# Patient Record
Sex: Female | Born: 1980 | Race: White | Hispanic: No | Smoking: Current some day smoker
Health system: Southern US, Community
[De-identification: ages and names within clinical notes are randomized; demographics above are authoritative.]

## PROBLEM LIST (undated history)

## (undated) DIAGNOSIS — K7460 Unspecified cirrhosis of liver: Secondary | ICD-10-CM

## (undated) DIAGNOSIS — Q613 Polycystic kidney, unspecified: Secondary | ICD-10-CM

## (undated) DIAGNOSIS — C801 Malignant (primary) neoplasm, unspecified: Secondary | ICD-10-CM

## (undated) DIAGNOSIS — C189 Malignant neoplasm of colon, unspecified: Secondary | ICD-10-CM

## (undated) DIAGNOSIS — S069XAA Unspecified intracranial injury with loss of consciousness status unknown, initial encounter: Secondary | ICD-10-CM

## (undated) DIAGNOSIS — K7031 Alcoholic cirrhosis of liver with ascites: Secondary | ICD-10-CM

## (undated) DIAGNOSIS — S069X9A Unspecified intracranial injury with loss of consciousness of unspecified duration, initial encounter: Secondary | ICD-10-CM

## (undated) HISTORY — DX: Unspecified intracranial injury with loss of consciousness status unknown, initial encounter: S06.9XAA

## (undated) HISTORY — PX: ESOPHAGOGASTRODUODENOSCOPY W/ BANDING: SHX1530

## (undated) HISTORY — DX: Polycystic kidney, unspecified: Q61.3

## (undated) HISTORY — DX: Unspecified intracranial injury with loss of consciousness of unspecified duration, initial encounter: S06.9X9A

## (undated) NOTE — *Deleted (*Deleted)
Initial Nutrition Assessment  DOCUMENTATION CODES:      INTERVENTION:  ***   NUTRITION DIAGNOSIS:     related to   as evidenced by  .  ***  GOAL:      ***  MONITOR:      REASON FOR ASSESSMENT:   Malnutrition Screening Tool    ASSESSMENT:   Pt presented with hypovolemic shock 2/2 intractable N/V.  PMH includes alcoholic cardiomyopathy, systolic heart failure w/ ICD, alcoholic cirrhosis, esophageal varices, h/o EtOH abuse (pt reports 1 year of sobriety).  Pt reports being in her usual state of health until ~1 month ago when she began having acute on chronic abdominal pain with N/V. Per pt, she was only able to keep liquids down and only with small amounts.   No PO intake documented. Of note, pt currently with orders for Ensure Enlive BID despite being on a clear liquid diet.   Per Care Everywhere, pt weighed 52.6kg on 12/04/19. Pt now weighs 49.9 kg. This indicates a 4.9% wt loss x2 months, which is insignificant for time frame.   UOP: x24 hours I/O: +2888.80ml since admit  Labs and medications reviewed.  NUTRITION - FOCUSED PHYSICAL EXAM:  {RD Focused Exam List:21252}  Diet Order:   Diet Order            Diet clear liquid Room service appropriate? Yes; Fluid consistency: Thin  Diet effective now                 EDUCATION NEEDS:      Skin:  Skin Assessment: Reviewed RN Assessment  Last BM:  10/20 type 7  Height:   Ht Readings from Last 1 Encounters:  01/29/20 5\' 2"  (1.575 m)    Weight:   Wt Readings from Last 1 Encounters:  01/29/20 49.9 kg    Ideal Body Weight:     BMI:  Body mass index is 20.12 kg/m.  Estimated Nutritional Needs:   Kcal:  1550-1750  Protein:  75-90 grams  Fluid:  >/=1.55L/d    ***

---
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
## 2016-08-02 DIAGNOSIS — N939 Abnormal uterine and vaginal bleeding, unspecified: Secondary | ICD-10-CM

## 2016-08-02 NOTE — ED Triage Notes (Signed)
Called to pick up patient out of bathroom floor.   On entering bathroom,  Pt in locked stall.  Pt states she passed out.   Pt leaning against wall.  States she is unable to get up and  Unlock door.    On entering stall.  Pt states she cannot get.  Up.  Pt picked up and placed in wheelchair

## 2016-08-02 NOTE — ED Triage Notes (Signed)
Pt has AA sponsor at bedside.  PT was in 90 day detox program for alcoholism and was recently released.  Pt states "life problems happened" that she was drinking today.  Pt states she has had a distended abdomen for months (since before detox program) and has had vaginal bleeding.  Pt states she has a history of uterine cancer and has had her uterus removed.

## 2016-08-02 NOTE — ED Notes (Signed)
Pt on bed with sponsor at bedside.  Vital signs stable and IVF continued.  Will continue to monitor pt and carry out orders.

## 2016-08-03 ENCOUNTER — Emergency Department: Admit: 2016-08-03 | Payer: Self-pay | Primary: Family Medicine

## 2016-08-03 ENCOUNTER — Inpatient Hospital Stay
Admit: 2016-08-03 | Discharge: 2016-08-03 | Disposition: A | Discharge status: Home / Self Care | Payer: Self-pay | Attending: Emergency Medicine

## 2016-08-03 LAB — HEPATIC FUNCTION PANEL
A-G Ratio: 0.8 (ref 0.8–1.7)
ALT (SGPT): 43 U/L (ref 13–56)
AST (SGOT): 59 U/L — ABNORMAL HIGH (ref 15–37)
Albumin: 3.1 g/dL — ABNORMAL LOW (ref 3.4–5.0)
Alk. phosphatase: 127 U/L — ABNORMAL HIGH (ref 45–117)
Bilirubin, direct: 0.2 MG/DL (ref 0.0–0.2)
Bilirubin, total: 0.3 MG/DL (ref 0.2–1.0)
Globulin: 4.1 g/dL — ABNORMAL HIGH (ref 2.0–4.0)
Protein, total: 7.2 g/dL (ref 6.4–8.2)

## 2016-08-03 LAB — CBC WITH AUTOMATED DIFF
ABS. BASOPHILS: 0.1 10*3/uL (ref 0.0–0.1)
ABS. EOSINOPHILS: 0.5 10*3/uL — ABNORMAL HIGH (ref 0.0–0.4)
ABS. LYMPHOCYTES: 4.5 10*3/uL — ABNORMAL HIGH (ref 0.9–3.6)
ABS. MONOCYTES: 0.9 10*3/uL (ref 0.05–1.2)
ABS. NEUTROPHILS: 3.9 10*3/uL (ref 1.8–8.0)
BASOPHILS: 1 % (ref 0–2)
EOSINOPHILS: 5 % (ref 0–5)
HCT: 39.4 % (ref 35.0–45.0)
HGB: 13.3 g/dL (ref 12.0–16.0)
LYMPHOCYTES: 45 % (ref 21–52)
MCH: 32.1 PG (ref 24.0–34.0)
MCHC: 33.8 g/dL (ref 31.0–37.0)
MCV: 95.2 FL (ref 74.0–97.0)
MONOCYTES: 9 % (ref 3–10)
MPV: 10 FL (ref 9.2–11.8)
NEUTROPHILS: 40 % (ref 40–73)
PLATELET: 206 10*3/uL (ref 135–420)
RBC: 4.14 M/uL — ABNORMAL LOW (ref 4.20–5.30)
RDW: 13.9 % (ref 11.6–14.5)
WBC: 9.9 10*3/uL (ref 4.6–13.2)

## 2016-08-03 LAB — URINALYSIS W/ RFLX MICROSCOPIC
Bilirubin: NEGATIVE
Glucose: NEGATIVE mg/dL
Ketone: NEGATIVE mg/dL
Leukocyte Esterase: NEGATIVE
Nitrites: NEGATIVE
Protein: NEGATIVE mg/dL
Specific gravity: 1.005 (ref 1.005–1.030)
Urobilinogen: 0.2 EU/dL (ref 0.2–1.0)
pH (UA): 6.5 (ref 5.0–8.0)

## 2016-08-03 LAB — METABOLIC PANEL, BASIC
Anion gap: 9 mmol/L (ref 3.0–18)
BUN/Creatinine ratio: 7 — ABNORMAL LOW (ref 12–20)
BUN: 4 MG/DL — ABNORMAL LOW (ref 7.0–18)
CO2: 25 mmol/L (ref 21–32)
Calcium: 8.2 MG/DL — ABNORMAL LOW (ref 8.5–10.1)
Chloride: 111 mmol/L — ABNORMAL HIGH (ref 100–108)
Creatinine: 0.6 MG/DL (ref 0.6–1.3)
GFR est AA: >60 mL/min/{1.73_m2} (ref >60)
GFR est non-AA: >60 mL/min/{1.73_m2} (ref >60)
Glucose: 82 mg/dL (ref 74–99)
Potassium: 3 mmol/L — ABNORMAL LOW (ref 3.5–5.5)
Sodium: 145 mmol/L (ref 136–145)

## 2016-08-03 LAB — URINE MICROSCOPIC ONLY
RBC: 0 /hpf (ref 0–5)
WBC: 0 /hpf (ref 0–4)

## 2016-08-03 LAB — DRUG SCREEN, URINE
AMPHETAMINES: NEGATIVE
BARBITURATES: NEGATIVE
BENZODIAZEPINES: NEGATIVE
COCAINE: NEGATIVE
METHADONE: NEGATIVE
OPIATES: NEGATIVE
PCP(PHENCYCLIDINE): NEGATIVE
THC (TH-CANNABINOL): NEGATIVE

## 2016-08-03 LAB — MAGNESIUM: Magnesium: 2.1 mg/dL (ref 1.6–2.6)

## 2016-08-03 LAB — ETHYL ALCOHOL: ALCOHOL(ETHYL),SERUM: 423 MG/DL — CR (ref 0–3)

## 2016-08-03 LAB — HCG QL SERUM: HCG, Ql.: NEGATIVE

## 2016-08-03 MED ORDER — SODIUM CHLORIDE 0.9% BOLUS IV
0.9 % | Freq: Once | INTRAVENOUS | Status: AC
Start: 2016-08-03 — End: 2016-08-03
  Administered 2016-08-03: 03:00:00 via INTRAVENOUS

## 2016-08-03 MED ORDER — MVI,ADULT NO.4,VIT K 3300 UNIT-150 MCG/10 ML INTRAVENOUS SOLUTION
3300 unit- 150 mcg/10 mL | Freq: Once | INTRAVENOUS | Status: AC
Start: 2016-08-03 — End: 2016-08-03
  Administered 2016-08-03: 04:00:00 via INTRAVENOUS

## 2016-08-03 MED FILL — SODIUM CHLORIDE 0.9 % IV: INTRAVENOUS | Qty: 1000

## 2016-08-03 NOTE — ED Provider Notes (Signed)
EMERGENCY DEPARTMENT HISTORY AND PHYSICAL EXAM    Date: 08/02/2016  Patient Name: Vanessa Rosales    History of Presenting Illness     Chief Complaint   Patient presents with   ??? Alcohol intoxication   ??? Vaginal Bleeding         History Provided By: Patient and AA Sponsor    Chief Complaint: vaginal bleeding and ETOH intoxication  Duration: 5 Days  Timing:  Acute  Location: vagina  Quality: unknown  Severity: Moderate  Modifying Factors: unknown  Associated Symptoms: unknown      Additional History (Context): Vanessa Rosales is a 36 y.o. female with prior partial hysterectomy, ETOH abuse who presents with vaginal bleeding x 3+d and acute ETOH intoxication.  Pt is <5d post-discharge from 44 ETOH detox program in [place] where pt lives.  Came here to be with family; sponsor has only known pt 5d.  Is c/o vaginal bleeding to brother.  Pt acutely intoxicated.    PCP: None        Past History     Past Medical History:  No past medical history on file.    Past Surgical History:  No past surgical history on file.    Family History:  No family history on file.    Social History:  Social History   Substance Use Topics   ??? Smoking status: Not on file   ??? Smokeless tobacco: Not on file   ??? Alcohol use Yes       Allergies:  No Known Allergies      Review of Systems   Review of Systems   Unable to perform ROS: Other (severe ETOH intoxication)   All other systems reviewed and are negative.    All Other Systems Negative  Physical Exam     Vitals:    08/03/16 0000 08/03/16 0145 08/03/16 0200 08/03/16 0330   BP: 110/77 108/70 110/75 106/72   Pulse: 94 (!) 105 (!) 101 100   Resp: '19 16 18 12   ' Temp:       SpO2: 98% 98% 97% 95%     Physical Exam   Constitutional: She is oriented to person, place, and time. She appears well-developed.   snoring   HENT:   Head: Normocephalic and atraumatic.   Eyes: Pupils are equal, round, and reactive to light.   Neck: No JVD present. No tracheal deviation present. No thyromegaly present.    Cardiovascular: Normal rate, regular rhythm and normal heart sounds.  Exam reveals no gallop and no friction rub.    No murmur heard.  Pulmonary/Chest: Effort normal and breath sounds normal. No stridor. No respiratory distress. She has no wheezes. She has no rales. She exhibits no tenderness.   Abdominal: Soft. She exhibits no distension and no mass. There is no tenderness. There is no rebound and no guarding.   Musculoskeletal: She exhibits no edema or tenderness.   Lymphadenopathy:     She has no cervical adenopathy.   Neurological: She is alert and oriented to person, place, and time.   Skin: Skin is warm and dry. No rash noted. No erythema. No pallor.   Psychiatric: She has a normal mood and affect. Her behavior is normal. Thought content normal.   Strong ETOH odor of breath   Nursing note and vitals reviewed.             Diagnostic Study Results     Labs -     Recent Results (from the past 12  hour(s))   CBC WITH AUTOMATED DIFF    Collection Time: 08/02/16 10:25 PM   Result Value Ref Range    WBC 9.9 4.6 - 13.2 K/uL    RBC 4.14 (L) 4.20 - 5.30 M/uL    HGB 13.3 12.0 - 16.0 g/dL    HCT 39.4 35.0 - 45.0 %    MCV 95.2 74.0 - 97.0 FL    MCH 32.1 24.0 - 34.0 PG    MCHC 33.8 31.0 - 37.0 g/dL    RDW 13.9 11.6 - 14.5 %    PLATELET 206 135 - 420 K/uL    MPV 10.0 9.2 - 11.8 FL    NEUTROPHILS 40 40 - 73 %    LYMPHOCYTES 45 21 - 52 %    MONOCYTES 9 3 - 10 %    EOSINOPHILS 5 0 - 5 %    BASOPHILS 1 0 - 2 %    ABS. NEUTROPHILS 3.9 1.8 - 8.0 K/UL    ABS. LYMPHOCYTES 4.5 (H) 0.9 - 3.6 K/UL    ABS. MONOCYTES 0.9 0.05 - 1.2 K/UL    ABS. EOSINOPHILS 0.5 (H) 0.0 - 0.4 K/UL    ABS. BASOPHILS 0.1 0.0 - 0.1 K/UL    DF AUTOMATED     METABOLIC PANEL, BASIC    Collection Time: 08/02/16 10:25 PM   Result Value Ref Range    Sodium 145 136 - 145 mmol/L    Potassium 3.0 (L) 3.5 - 5.5 mmol/L    Chloride 111 (H) 100 - 108 mmol/L    CO2 25 21 - 32 mmol/L    Anion gap 9 3.0 - 18 mmol/L    Glucose 82 74 - 99 mg/dL    BUN 4 (L) 7.0 - 18 MG/DL     Creatinine 0.60 0.6 - 1.3 MG/DL    BUN/Creatinine ratio 7 (L) 12 - 20      GFR est AA >60 >60 ml/min/1.70m    GFR est non-AA >60 >60 ml/min/1.732m   Calcium 8.2 (L) 8.5 - 10.1 MG/DL   URINALYSIS W/ RFLX MICROSCOPIC    Collection Time: 08/02/16 10:25 PM   Result Value Ref Range    Color YELLOW      Appearance CLEAR      Specific gravity 1.005 1.005 - 1.030      pH (UA) 6.5 5.0 - 8.0      Protein NEGATIVE  NEG mg/dL    Glucose NEGATIVE  NEG mg/dL    Ketone NEGATIVE  NEG mg/dL    Bilirubin NEGATIVE  NEG      Blood LARGE (A) NEG      Urobilinogen 0.2 0.2 - 1.0 EU/dL    Nitrites NEGATIVE  NEG      Leukocyte Esterase NEGATIVE  NEG     DRUG SCREEN, URINE    Collection Time: 08/02/16 10:25 PM   Result Value Ref Range    BENZODIAZEPINES NEGATIVE  NEG      BARBITURATES NEGATIVE  NEG      THC (TH-CANNABINOL) NEGATIVE  NEG      OPIATES NEGATIVE  NEG      PCP(PHENCYCLIDINE) NEGATIVE  NEG      COCAINE NEGATIVE  NEG      AMPHETAMINES NEGATIVE  NEG      METHADONE NEGATIVE  NEG      HDSCOM (NOTE)    ETHYL ALCOHOL    Collection Time: 08/02/16 10:25 PM   Result Value Ref Range    ALCOHOL(ETHYL),SERUM 423 (HH)  0 - 3 MG/DL   URINE MICROSCOPIC ONLY    Collection Time: 08/02/16 10:25 PM   Result Value Ref Range    WBC 0 to 3 0 - 4 /hpf    RBC 0 to 3 0 - 5 /hpf    Epithelial cells 1+ 0 - 5 /lpf    Bacteria 1+ (A) NEG /hpf   MAGNESIUM    Collection Time: 08/02/16 10:25 PM   Result Value Ref Range    Magnesium 2.1 1.6 - 2.6 mg/dL   HEPATIC FUNCTION PANEL    Collection Time: 08/02/16 10:25 PM   Result Value Ref Range    Protein, total 7.2 6.4 - 8.2 g/dL    Albumin 3.1 (L) 3.4 - 5.0 g/dL    Globulin 4.1 (H) 2.0 - 4.0 g/dL    A-G Ratio 0.8 0.8 - 1.7      Bilirubin, total 0.3 0.2 - 1.0 MG/DL    Bilirubin, direct 0.2 0.0 - 0.2 MG/DL    Alk. phosphatase 127 (H) 45 - 117 U/L    AST (SGOT) 59 (H) 15 - 37 U/L    ALT (SGPT) 43 13 - 56 U/L   HCG QL SERUM    Collection Time: 08/02/16 10:25 PM   Result Value Ref Range    HCG, Ql. NEGATIVE  NEG          Radiologic Studies -   US TRANSVAGINAL   Final Result        CT Results  (Last 48 hours)    None        CXR Results  (Last 48 hours)    None            Medical Decision Making   I am the first provider for this patient.    I reviewed the vital signs, available nursing notes, past medical history, past surgical history, family history and social history.    Vital Signs-Reviewed the patient's vital signs.    Records Reviewed: Nursing Notes    Procedures:  Pelvic Exam  Date/Time: 08/03/2016 12:48 AM  Performed by: PA  Procedure duration:  3 minutes.  Type of exam performed: speculum.    External genitalia appearance: normal.    Vaginal exam:  bleeding.    Bleeding: mild  Specimen(s) collected:  none.  Patient tolerance: Patient tolerated the procedure well with no immediate complications            Provider Notes (Medical Decision Making): vaginal bleeding; malignancy; excessive anticoagulation; thrombocytopenia; hematuria    +vaginal bleeding on exam.  Obtain US.    Clear medically then have pt speak with crisis re: possible inpatient treatment again.    3:19 AM  US shows a uterus.      Pt told nurse apparently she didn't want to be seen by a PA or NP; will be turning care over to ED attending at end of shift.  Will need several hours of sobriety before speaking with crisis.  She remains voluntary; will let her continue to rest.  Her sponsor, Baldo Ash, has left and knows to return between 10a-12p today.      4:06 AM  4:06 AM : Pt care transferred to Dr. Heber Carolina ,ED provider. History of patient complaint(s), available diagnostic reports and current treatment plan has been discussed thoroughly.   Bedside rounding on patient occured : no .  Intended disposition of patient : TBD  Pending diagnostics reports and/or labs (please list): sobriety      MED RECONCILIATION:  No current facility-administered medications for this encounter.      No current outpatient prescriptions on file.       Disposition:  TBD         Follow-up Information     None          There are no discharge medications for this patient.      Diagnosis     Clinical Impression: No diagnosis found.

## 2016-08-03 NOTE — ED Notes (Signed)
Pt transported to [place] by transport on stretcher

## 2016-08-03 NOTE — ED Notes (Signed)
Pt resting on stretcher with sponsor at bedside.  No acute distress noted and stable vital signs.  Will continue to monitor pt and await further orders.

## 2016-08-03 NOTE — ED Notes (Signed)
I have reviewed discharge instructions with patient.  No acute distress noted at this time, pt alert and oriented.  Stable at time of discharge.  Vital signs stable.  Pt refused to sign discharge instructions.  MD Hoffman witnessed.  Pt given paper scrubs to go to lobby.  Pt given phone to call for ride.

## 2016-08-03 NOTE — ED Notes (Signed)
Pt ambulated to bedside commode with stand by assistance.

## 2016-08-03 NOTE — ED Notes (Signed)
Pt sleeping with sponsor at bedside.  Continued IV fluids.  No acute distress noted.  Vital signs stable.  Will continue to monitor pt and await further orders.

## 2016-08-03 NOTE — ED Notes (Shared)
4:12 AM: Patient care assumed from PA Waldo Laine, ED provider. Patient complaint(s), current treatment plan, progression, and available diagnostic results have been discussed thoroughly.  Rounding occurred: yes  Intended Disposition: Home   Pending diagnostic reports and/or labs (please list): None, Ambulation    Progress notes:  Patient was asleep upon examination, but roused easily to voice. She states that she has had vaginal bleeding recently. States "I haven't had a period in years." Discussed with her that her [place] was normal, showed good blood flow, no cysts. Encouraged her to follow up with OB/GYN to rule out cancer. She voiced understanding. Patient reports that she was released from rehab on Monday, April 16 and traveled here from [place] to stay with her brother. She states that she was able to avoid drinking alcohol for her 15-hour train ride here, but "my brother sent me over the edge." Offered patient resources for outpatient follow-up, resources, and AA meetings. She declined and stated that she already has this information. Patient states that she has a sponsor and plan for discharge that she plans on carrying out. 4:17 AM     Patient concerned about getting home. She states that her sponsor, Claris Gower, has all of her possessions that were with her upon arrival to the ED. Provided phone to patient and [place]'s phone number: [phone]. 4:32 AM     Disposition: Discharge     Follow-up Information     Follow up With Details Comments Contact Info    [facility] Call in 1 day  [address]  [place] [address]  [phone]    [facility] EMERGENCY DEPT  As needed, If symptoms worsen [address]  [place] [address]  [phone]           Patient's Medications    No medications on file         Scribe Attestation:     Curt Jews, acting as a scribe for and in the presence of Vaughan Browner, MD      August 03, 2016 at 4:12 AM       Provider Attestation:       I personally performed the services described in the documentation, reviewed the documentation, as recorded by the scribe in my presence, and it accurately and completely records my words and actions. August 03, 2016 at 4:12 AM - Vaughan Browner, MD

## 2016-08-03 NOTE — ED Triage Notes (Signed)
Pt sleeping on stretcher.

## 2016-10-19 ENCOUNTER — Emergency Department: Payer: Self-pay | Primary: Family Medicine

## 2016-10-19 ENCOUNTER — Emergency Department: Admit: 2016-10-20 | Payer: Self-pay | Primary: Family Medicine

## 2016-10-19 DIAGNOSIS — R079 Chest pain, unspecified: Secondary | ICD-10-CM

## 2016-10-19 NOTE — ED Triage Notes (Signed)
Chest pressure since 0900 this AM. Accompanied by SOB and fatigue

## 2016-10-19 NOTE — ED Provider Notes (Signed)
HPI Comments: Vanessa Rosales is a 36 y.o. Female with c/o severe left sided sharp cp with radiation down left arm since this morning about 9am. Sob worse with movement, deep breathing. No fcs. nvd today as well. Recently had abortion for 14 week pregnancy a week ago. No recent leg pain, swelling, h/o vte. Admits to some alcohol earlier today. Smokes cigarettes. Nothing taken for pain    The history is provided by the patient.        Past Medical History:   Diagnosis Date   ??? Hypertension    ??? Mitral regurgitation        History reviewed. No pertinent surgical history.      History reviewed. No pertinent family history.    Social History     Social History   ??? Marital status: SINGLE     Spouse name: N/A   ??? Number of children: N/A   ??? Years of education: N/A     Occupational History   ??? Not on file.     Social History Main Topics   ??? Smoking status: Current Every Day Smoker     Packs/day: 0.25   ??? Smokeless tobacco: Never Used   ??? Alcohol use Yes   ??? Drug use: No   ??? Sexual activity: Not on file     Other Topics Concern   ??? Not on file     Social History Narrative         ALLERGIES: Bee venom protein (honey bee) and Sulfa (sulfonamide antibiotics)    Review of Systems   Constitutional: Negative for fever.   HENT: Negative for sore throat and trouble swallowing.    Eyes: Negative for visual disturbance.   Respiratory: Positive for shortness of breath. Negative for cough.    Cardiovascular: Positive for chest pain. Negative for leg swelling.   Gastrointestinal: Positive for abdominal pain.   Endocrine: Negative for polyuria.   Genitourinary: Negative for difficulty urinating and vaginal bleeding.   Musculoskeletal: Negative for gait problem.   Allergic/Immunologic: Negative for immunocompromised state.   Neurological: Negative for syncope.   Psychiatric/Behavioral: Positive for sleep disturbance.       Vitals:    10/20/16 0024 10/20/16 0130 10/20/16 0145 10/20/16 0200   BP:  (!) 127/99 (!) 137/97 (!) 131/92    Pulse: 96  88 82   Resp: '21  16 18   ' Temp:       SpO2: 97%  96% 96%   Weight:       Height:                Physical Exam   Constitutional: She is oriented to person, place, and time. She appears well-developed and well-nourished. She appears distressed.   Appears older than stated age. Non toxic. Appears intoxicated     HENT:   Head: Normocephalic and atraumatic.   Right Ear: External ear normal.   Left Ear: External ear normal.   Nose: Nose normal.   Mouth/Throat: Uvula is midline, oropharynx is clear and moist and mucous membranes are normal.   Eyes: Conjunctivae are normal. No scleral icterus.   Neck: Neck supple.   Cardiovascular: Normal rate, regular rhythm, normal heart sounds and intact distal pulses.    Pulmonary/Chest: Effort normal and breath sounds normal. She exhibits tenderness.   Abdominal: Soft. Normal appearance. She exhibits no distension and no mass. There is tenderness in the epigastric area and left upper quadrant. There is no rebound and no guarding.  Musculoskeletal: She exhibits no edema.   Neurological: She is alert and oriented to person, place, and time. Gait normal.   Skin: Skin is warm and dry. She is not diaphoretic.   Psychiatric: Her behavior is normal.   Nursing note and vitals reviewed.       MDM      ED Course       Procedures      Vitals:  Patient Vitals for the past 12 hrs:   Temp Pulse Resp BP SpO2   10/20/16 0200 - 82 18 (!) 131/92 96 %   10/20/16 0145 - 88 16 (!) 137/97 96 %   10/20/16 0130 - - - (!) 127/99 -   10/20/16 0024 - 96 21 - 97 %   10/20/16 0021 - - - 134/89 -   10/20/16 0010 - 88 20 - 96 %   10/20/16 0000 - 85 20 124/89 96 %   10/19/16 2345 - 85 15 127/88 97 %   10/19/16 2330 - 87 15 (!) 129/92 98 %   10/19/16 2315 - 90 22 (!) 131/91 96 %   10/19/16 2300 - 85 9 130/88 98 %   10/19/16 2245 - 95 19 (!) 146/101 97 %   10/19/16 2230 - - - (!) 139/103 -   10/19/16 2215 - 91 25 (!) 129/101 95 %   10/19/16 2141 98.3 ??F (36.8 ??C) (!) 111 14 (!) 142/100 96 %          Medications ordered:   Medications   morphine injection 2 mg (2 mg IntraVENous Refused 10/19/16 2223)   ketorolac (TORADOL) injection 15 mg (15 mg IntraVENous Refused 10/19/16 2223)   ondansetron (ZOFRAN) injection 4 mg (4 mg IntraVENous Refused 10/19/16 2223)   sodium chloride 0.9 % bolus infusion 1,000 mL (0 mL IntraVENous IV Completed 10/19/16 2330)   magnesium sulfate 2 g/50 ml IVPB (premix or compounded) (0 g IntraVENous IV Completed 10/19/16 2334)   0.9% sodium chloride with KCl 20 mEq/L infusion ( IntraVENous IV Completed 10/20/16 0026)   cefTRIAXone (ROCEPHIN) 1 g in 0.9% sodium chloride (MBP/ADV) 50 mL MBP (0 g IntraVENous IV Completed 10/20/16 0003)   technetium pentetate (Tc-DTPA) injection 1 millicurie (1 millicurie Inhalation Given 10/20/16 0055)   technetium albumin aggregated (MAA) solution 6.3 millicurie (6.3 millicuries IntraVENous Given 10/20/16 0055)         Lab findings:  Recent Results (from the past 12 hour(s))   EKG, 12 LEAD, INITIAL    Collection Time: 10/19/16  9:57 PM   Result Value Ref Range    Ventricular Rate 93 BPM    Atrial Rate 93 BPM    P-R Interval 156 ms    QRS Duration 84 ms    Q-T Interval 396 ms    QTC Calculation (Bezet) 492 ms    Calculated P Axis 69 degrees    Calculated R Axis 35 degrees    Calculated T Axis 61 degrees    Diagnosis       Normal sinus rhythm  Possible Left atrial enlargement  Anteroseptal infarct , age undetermined  Abnormal ECG  No previous ECGs available     CBC WITH AUTOMATED DIFF    Collection Time: 10/19/16 10:06 PM   Result Value Ref Range    WBC 7.2 4.6 - 13.2 K/uL    RBC 5.20 4.20 - 5.30 M/uL    HGB 16.5 (H) 12.0 - 16.0 g/dL    HCT 47.5 (H) 35.0 - 45.0 %  MCV 91.3 74.0 - 97.0 FL    MCH 31.7 24.0 - 34.0 PG    MCHC 34.7 31.0 - 37.0 g/dL    RDW 16.1 (H) 11.6 - 14.5 %    PLATELET 159 135 - 420 K/uL    MPV 9.8 9.2 - 11.8 FL    NEUTROPHILS 52 40 - 73 %    LYMPHOCYTES 30 21 - 52 %    MONOCYTES 13 (H) 3 - 10 %    EOSINOPHILS 4 0 - 5 %    BASOPHILS 1 0 - 2 %     ABS. NEUTROPHILS 3.8 1.8 - 8.0 K/UL    ABS. LYMPHOCYTES 2.2 0.9 - 3.6 K/UL    ABS. MONOCYTES 0.9 0.05 - 1.2 K/UL    ABS. EOSINOPHILS 0.3 0.0 - 0.4 K/UL    ABS. BASOPHILS 0.1 0.0 - 0.1 K/UL    DF AUTOMATED     D DIMER    Collection Time: 10/19/16 10:06 PM   Result Value Ref Range    D DIMER 0.64 (H) <0.46 ug/ml(FEU)   METABOLIC PANEL, COMPREHENSIVE    Collection Time: 10/19/16 10:06 PM   Result Value Ref Range    Sodium 145 136 - 145 mmol/L    Potassium 2.9 (LL) 3.5 - 5.5 mmol/L    Chloride 105 100 - 108 mmol/L    CO2 30 21 - 32 mmol/L    Anion gap 10 3.0 - 18 mmol/L    Glucose 102 (H) 74 - 99 mg/dL    BUN 5 (L) 7.0 - 18 MG/DL    Creatinine 0.57 (L) 0.6 - 1.3 MG/DL    BUN/Creatinine ratio 9 (L) 12 - 20      GFR est AA >60 >60 ml/min/1.63m    GFR est non-AA >60 >60 ml/min/1.756m   Calcium 7.9 (L) 8.5 - 10.1 MG/DL    Bilirubin, total 0.5 0.2 - 1.0 MG/DL    ALT (SGPT) 62 (H) 13 - 56 U/L    AST (SGOT) 124 (H) 15 - 37 U/L    Alk. phosphatase 177 (H) 45 - 117 U/L    Protein, total 7.2 6.4 - 8.2 g/dL    Albumin 3.4 3.4 - 5.0 g/dL    Globulin 3.8 2.0 - 4.0 g/dL    A-G Ratio 0.9 0.8 - 1.7     ETHYL ALCOHOL    Collection Time: 10/19/16 10:06 PM   Result Value Ref Range    ALCOHOL(ETHYL),SERUM 374 (HH) 0 - 3 MG/DL   LIPASE    Collection Time: 10/19/16 10:06 PM   Result Value Ref Range    Lipase 205 73 - 393 U/L   HCG QL SERUM    Collection Time: 10/19/16 10:06 PM   Result Value Ref Range    HCG, Ql. NEGATIVE  NEG     URINALYSIS W/ RFLX MICROSCOPIC    Collection Time: 10/19/16 10:30 PM   Result Value Ref Range    Color YELLOW      Appearance CLEAR      Specific gravity 1.009 1.005 - 1.030      pH (UA) 8.0 5.0 - 8.0      Protein NEGATIVE  NEG mg/dL    Glucose NEGATIVE  NEG mg/dL    Ketone NEGATIVE  NEG mg/dL    Bilirubin NEGATIVE  NEG      Blood NEGATIVE  NEG      Urobilinogen 0.2 0.2 - 1.0 EU/dL    Nitrites NEGATIVE  NEG  Leukocyte Esterase MODERATE (A) NEG     DRUG SCREEN, URINE    Collection Time: 10/19/16 10:30 PM    Result Value Ref Range    BENZODIAZEPINES NEGATIVE  NEG      BARBITURATES NEGATIVE  NEG      THC (TH-CANNABINOL) NEGATIVE  NEG      OPIATES NEGATIVE  NEG      PCP(PHENCYCLIDINE) NEGATIVE  NEG      COCAINE NEGATIVE  NEG      AMPHETAMINES NEGATIVE  NEG      METHADONE NEGATIVE  NEG      HDSCOM (NOTE)    URINE MICROSCOPIC ONLY    Collection Time: 10/19/16 10:30 PM   Result Value Ref Range    WBC 10 to 15 0 - 4 /hpf    RBC 0 0 - 5 /hpf    Epithelial cells FEW 0 - 5 /lpf    Bacteria 1+ (A) NEG /hpf       EKG interpretation by ED Physician:  nsr with no acute st tw changes  Rate 93, pr 156, qtc 492  No previous    Cardiac monitor: nl rate reg rhythm. No ectopy  Pulse ox: 96% ra        X-Ray, CT or other radiology findings or impressions:  XR CHEST PORT    (Results Pending)   NM LUNG PERFUSION W VENT    (Results Pending)   cxr with nap  vq with low prob  Progress notes, Consult notes or additional Procedure notes:   Was going to get cta given her sx and slightly elevated dimer but pt informed tech she had iodine allergy so wil get vq  Doubt need for serial testing. Pt with no nv here.   Will treat for uti, rec /fu as listed  I have discussed with patient and/or family/sig other the results, interpretation of any imaging if performed, suspected diagnosis and treatment plan to include instructions regarding the diagnoses listed to which understanding was expressed with all questions answered      Reevaluation of patient:   stable    Disposition:  Diagnosis:   1. Left sided chest pain    2. Acute UTI    3. Alcoholic intoxication with complication ([place])    4. Alcohol abuse    5. Hypokalemia    6. Non-intractable vomiting with nausea, unspecified vomiting type        Disposition: home    Follow-up Information     Follow up With Details Comments [facility] Schedule an appointment as soon as possible for a visit for follow up from your ER visit and recheck of your blood  pressure or with your regular doctor if you already have one [address] [place] Schedule an appointment as soon as possible for a visit for follow up on your alcohol use disorder [place]    [facility] EMERGENCY DEPT  If symptoms worsen [address]  [phone]            Patient's Medications   Start Taking    ACETAMINOPHEN (TYLENOL EXTRA STRENGTH) 500 MG TABLET    Take 2 Tabs by mouth every six (6) hours as needed for Pain.    CEPHALEXIN (KEFLEX) 500 MG CAPSULE    Take 2 Caps by mouth two (2)  times a day for 7 days.    FAMOTIDINE (PEPCID) 20 MG TABLET    Take 1 Tab by mouth two (2) times a day for 10 days.    ONDANSETRON (ZOFRAN ODT) 4 MG DISINTEGRATING TABLET    Take 1 Tab by mouth every eight (8) hours as needed for Nausea.   Continue Taking    LISINOPRIL (PRINIVIL, ZESTRIL) 20 MG TABLET    Take 20 mg by mouth two (2) times a day.   These Medications have changed    No medications on file   Stop Taking    No medications on file

## 2016-10-20 ENCOUNTER — Inpatient Hospital Stay
Admit: 2016-10-20 | Discharge: 2016-10-20 | Disposition: A | Discharge status: Home / Self Care | Payer: Self-pay | Attending: Emergency Medicine

## 2016-10-20 ENCOUNTER — Emergency Department: Admit: 2016-10-20 | Payer: Self-pay | Primary: Family Medicine

## 2016-10-20 LAB — EKG 12-LEAD
Atrial Rate: 93 {beats}/min
Diagnosis: NORMAL
P Axis: 69 degrees
P-R Interval: 156 ms
Q-T Interval: 396 ms
QRS Duration: 84 ms
QTc Calculation (Bazett): 492 ms
R Axis: 35 degrees
T Axis: 61 degrees
Ventricular Rate: 93 {beats}/min

## 2016-10-20 LAB — D-DIMER, QUANTITATIVE: D-Dimer, Quant: 0.64 ug/ml(FEU) — ABNORMAL HIGH (ref <0.46)

## 2016-10-20 LAB — EKG, 12 LEAD, INITIAL
Atrial Rate: 93 {beats}/min
Calculated P Axis: 69 degrees
Calculated R Axis: 35 degrees
Calculated T Axis: 61 degrees
Diagnosis: NORMAL
P-R Interval: 156 ms
Q-T Interval: 396 ms
QRS Duration: 84 ms
QTC Calculation (Bezet): 492 ms
Ventricular Rate: 93 {beats}/min

## 2016-10-20 LAB — DRUG SCREEN, URINE
AMPHETAMINES: NEGATIVE
BARBITURATES: NEGATIVE
BENZODIAZEPINES: NEGATIVE
COCAINE: NEGATIVE
METHADONE: NEGATIVE
OPIATES: NEGATIVE
PCP(PHENCYCLIDINE): NEGATIVE
THC (TH-CANNABINOL): NEGATIVE

## 2016-10-20 LAB — CBC WITH AUTOMATED DIFF
ABS. BASOPHILS: 0.1 10*3/uL (ref 0.0–0.1)
ABS. EOSINOPHILS: 0.3 10*3/uL (ref 0.0–0.4)
ABS. LYMPHOCYTES: 2.2 10*3/uL (ref 0.9–3.6)
ABS. MONOCYTES: 0.9 10*3/uL (ref 0.05–1.2)
ABS. NEUTROPHILS: 3.8 10*3/uL (ref 1.8–8.0)
BASOPHILS: 1 % (ref 0–2)
EOSINOPHILS: 4 % (ref 0–5)
HCT: 47.5 % — ABNORMAL HIGH (ref 35.0–45.0)
HGB: 16.5 g/dL — ABNORMAL HIGH (ref 12.0–16.0)
LYMPHOCYTES: 30 % (ref 21–52)
MCH: 31.7 PG (ref 24.0–34.0)
MCHC: 34.7 g/dL (ref 31.0–37.0)
MCV: 91.3 FL (ref 74.0–97.0)
MONOCYTES: 13 % — ABNORMAL HIGH (ref 3–10)
MPV: 9.8 FL (ref 9.2–11.8)
NEUTROPHILS: 52 % (ref 40–73)
PLATELET: 159 10*3/uL (ref 135–420)
RBC: 5.2 M/uL (ref 4.20–5.30)
RDW: 16.1 % — ABNORMAL HIGH (ref 11.6–14.5)
WBC: 7.2 10*3/uL (ref 4.6–13.2)

## 2016-10-20 LAB — URINALYSIS W/ RFLX MICROSCOPIC
Bilirubin: NEGATIVE
Blood: NEGATIVE
Glucose: NEGATIVE mg/dL
Ketone: NEGATIVE mg/dL
Nitrites: NEGATIVE
Protein: NEGATIVE mg/dL
Specific gravity: 1.009 (ref 1.005–1.030)
Urobilinogen: 0.2 EU/dL (ref 0.2–1.0)
pH (UA): 8 (ref 5.0–8.0)

## 2016-10-20 LAB — METABOLIC PANEL, COMPREHENSIVE
A-G Ratio: 0.9 (ref 0.8–1.7)
ALT (SGPT): 62 U/L — ABNORMAL HIGH (ref 13–56)
AST (SGOT): 124 U/L — ABNORMAL HIGH (ref 15–37)
Albumin: 3.4 g/dL (ref 3.4–5.0)
Alk. phosphatase: 177 U/L — ABNORMAL HIGH (ref 45–117)
Anion gap: 10 mmol/L (ref 3.0–18)
BUN/Creatinine ratio: 9 — ABNORMAL LOW (ref 12–20)
BUN: 5 MG/DL — ABNORMAL LOW (ref 7.0–18)
Bilirubin, total: 0.5 MG/DL (ref 0.2–1.0)
CO2: 30 mmol/L (ref 21–32)
Calcium: 7.9 MG/DL — ABNORMAL LOW (ref 8.5–10.1)
Chloride: 105 mmol/L (ref 100–108)
Creatinine: 0.57 MG/DL — ABNORMAL LOW (ref 0.6–1.3)
GFR est AA: >60 mL/min/{1.73_m2} (ref >60)
GFR est non-AA: >60 mL/min/{1.73_m2} (ref >60)
Globulin: 3.8 g/dL (ref 2.0–4.0)
Glucose: 102 mg/dL — ABNORMAL HIGH (ref 74–99)
Potassium: 2.9 mmol/L — CL (ref 3.5–5.5)
Protein, total: 7.2 g/dL (ref 6.4–8.2)
Sodium: 145 mmol/L (ref 136–145)

## 2016-10-20 LAB — D DIMER: D DIMER: 0.64 ug/ml(FEU) — ABNORMAL HIGH (ref <0.46)

## 2016-10-20 LAB — URINE MICROSCOPIC ONLY
RBC: 0 /hpf (ref 0–5)
WBC: 10 /hpf (ref 0–4)

## 2016-10-20 LAB — HCG QL SERUM: HCG, Ql.: NEGATIVE

## 2016-10-20 LAB — ETHYL ALCOHOL: ALCOHOL(ETHYL),SERUM: 374 MG/DL — CR (ref 0–3)

## 2016-10-20 LAB — LIPASE: Lipase: 205 U/L (ref 73–393)

## 2016-10-20 MED ORDER — SODIUM CHLORIDE 0.9 % IV
Freq: Once | INTRAVENOUS | Status: DC
Start: 2016-10-20 — End: 2016-10-19

## 2016-10-20 MED ORDER — CEFTRIAXONE 1 GRAM SOLUTION FOR INJECTION
1 gram | INTRAMUSCULAR | Status: AC
Start: 2016-10-20 — End: 2016-10-20
  Administered 2016-10-20: 04:00:00 via INTRAVENOUS

## 2016-10-20 MED ORDER — SODIUM CHLORIDE 0.9% BOLUS IV
0.9 % | Freq: Once | INTRAVENOUS | Status: AC
Start: 2016-10-20 — End: 2016-10-19
  Administered 2016-10-20: 03:00:00 via INTRAVENOUS

## 2016-10-20 MED ORDER — ONDANSETRON 4 MG TAB, RAPID DISSOLVE
4 mg | ORAL_TABLET | Freq: Three times a day (TID) | ORAL | 0 refills | Status: DC | PRN
Start: 2016-10-20 — End: 2017-07-16

## 2016-10-20 MED ORDER — MORPHINE 4 MG/ML INTRAVENOUS SOLUTION
4 mg/mL | INTRAVENOUS | Status: DC
Start: 2016-10-20 — End: 2016-10-20

## 2016-10-20 MED ORDER — TECHNETIUM PENTETATE
Freq: Once | Status: AC
Start: 2016-10-20 — End: 2016-10-20
  Administered 2016-10-20: 05:00:00 via RESPIRATORY_TRACT

## 2016-10-20 MED ORDER — FAMOTIDINE 20 MG TAB
20 mg | ORAL_TABLET | Freq: Two times a day (BID) | ORAL | 0 refills | Status: AC
Start: 2016-10-20 — End: 2016-10-30

## 2016-10-20 MED ORDER — CEPHALEXIN 500 MG CAP
500 mg | ORAL_CAPSULE | Freq: Two times a day (BID) | ORAL | 0 refills | Status: AC
Start: 2016-10-20 — End: 2016-10-27

## 2016-10-20 MED ORDER — MAGNESIUM SULFATE 2 GRAM/50 ML IVPB
2 gram/50 mL (4 %) | Freq: Once | INTRAVENOUS | Status: AC
Start: 2016-10-20 — End: 2016-10-19
  Administered 2016-10-20: 03:00:00 via INTRAVENOUS

## 2016-10-20 MED ORDER — IOPAMIDOL 76 % IV SOLN
370 mg iodine /mL (76 %) | Freq: Once | INTRAVENOUS | Status: DC
Start: 2016-10-20 — End: 2016-10-19

## 2016-10-20 MED ORDER — NS WITH POTASSIUM CHLORIDE 20 MEQ/L IV
20 mEq/L | Freq: Once | INTRAVENOUS | Status: AC
Start: 2016-10-20 — End: 2016-10-20
  Administered 2016-10-20: 03:00:00 via INTRAVENOUS

## 2016-10-20 MED ORDER — TECHNETIUM TO 99M ALBUMIN AGGREGATED
Freq: Once | Status: AC
Start: 2016-10-20 — End: 2016-10-20
  Administered 2016-10-20: 05:00:00 via INTRAVENOUS

## 2016-10-20 MED ORDER — KETOROLAC TROMETHAMINE 15 MG/ML INJECTION
15 mg/mL | INTRAMUSCULAR | Status: DC
Start: 2016-10-20 — End: 2016-10-20

## 2016-10-20 MED ORDER — ACETAMINOPHEN 500 MG TAB
500 mg | ORAL_TABLET | Freq: Four times a day (QID) | ORAL | 0 refills | Status: AC | PRN
Start: 2016-10-20 — End: ?

## 2016-10-20 MED ORDER — ONDANSETRON (PF) 4 MG/2 ML INJECTION
4 mg/2 mL | INTRAMUSCULAR | Status: DC
Start: 2016-10-20 — End: 2016-10-20

## 2016-10-20 MED FILL — SODIUM CHLORIDE 0.9 % IV: INTRAVENOUS | Qty: 100

## 2016-10-20 MED FILL — CEFTRIAXONE 1 GRAM SOLUTION FOR INJECTION: 1 gram | INTRAMUSCULAR | Qty: 1

## 2016-10-20 MED FILL — SODIUM CHLORIDE 0.9 % IV: INTRAVENOUS | Qty: 1000

## 2016-10-20 MED FILL — MORPHINE 4 MG/ML INTRAVENOUS SOLUTION: 4 mg/mL | INTRAVENOUS | Qty: 1

## 2016-10-20 MED FILL — ONDANSETRON (PF) 4 MG/2 ML INJECTION: 4 mg/2 mL | INTRAMUSCULAR | Qty: 2

## 2016-10-20 MED FILL — MAGNESIUM SULFATE 2 GRAM/50 ML IVPB: 2 gram/50 mL (4 %) | INTRAVENOUS | Qty: 50

## 2016-10-20 MED FILL — KETOROLAC TROMETHAMINE 15 MG/ML INJECTION: 15 mg/mL | INTRAMUSCULAR | Qty: 1

## 2016-10-20 MED FILL — NS WITH POTASSIUM CHLORIDE 20 MEQ/L IV: 20 mEq/L | INTRAVENOUS | Qty: 1000

## 2016-10-20 MED FILL — ISOVUE-370  76 % INTRAVENOUS SOLUTION: 370 mg iodine /mL (76 %) | INTRAVENOUS | Qty: 75

## 2016-10-20 NOTE — Progress Notes (Signed)
On keflex, awaiting sensitivity report. Eleyna Brugh J Carrera Kiesel, PA-C 12:29 PM

## 2016-10-20 NOTE — ED Notes (Signed)
I have reviewed discharge instructions with the patient.  The patient verbalized understanding.Patient armband removed and given to patient to take home.  Patient was informed of the privacy risks if armband lost or stolen

## 2016-10-22 LAB — CULTURE, URINE: Culture result:: >100000 — AB

## 2017-03-15 ENCOUNTER — Emergency Department: Admit: 2017-03-16 | Payer: Self-pay | Primary: Family Medicine

## 2017-03-15 DIAGNOSIS — K920 Hematemesis: Secondary | ICD-10-CM

## 2017-03-15 NOTE — ED Notes (Signed)
Pt's friend came to nurses station and yelled at staff wanting friend seen immidately and stated that there was no way all providers were busy and that they should be seen now.  RN explained that providers were in fact all busy and would see pt as soon as one is available.      2222:  Pt seen walking towards lobby with friend at side.  Charge RN to verify IV is removed.

## 2017-03-15 NOTE — ED Triage Notes (Signed)
Patient states she has been vomiting for several days.   Started vomiting blood yesterday.  Pt states she feels bad

## 2017-03-15 NOTE — ED Provider Notes (Signed)
EMERGENCY DEPARTMENT HISTORY AND PHYSICAL EXAM    8:29 PM      Date: 03/15/2017  Patient Name: Vanessa ElseJennifer Hollingshed    History of Presenting Illness     Chief Complaint   Patient presents with   ??? Blood in Vomit         History Provided By: Patient    Chief Complaint: Blood in vomit  Duration:  Days  Timing:  Constant  Location: Abd   Quality: Aching  Severity: Severe  Modifying Factors: none given  Associated Symptoms: nausea      Additional History (Context): Vanessa Rosales is a 36 y.o. female with mitral regurgitation and hypertension who presents with severe hematemesis that began 2 days ago. The pt also complains of nausea and is having trouble swallowing.  She denies melena, fever, chills and does not take blood thinners, but does have a family hx of polycystic kidney disease.  Pt does not drink alcohol frequently. She denies all other symptoms.      PCP: None    Current Outpatient Medications   Medication Sig Dispense Refill   ??? ondansetron (ZOFRAN ODT) 4 mg disintegrating tablet Take 1 Tab by mouth every eight (8) hours as needed for Nausea. 12 Tab 0   ??? acetaminophen (TYLENOL EXTRA STRENGTH) 500 mg tablet Take 2 Tabs by mouth every six (6) hours as needed for Pain. 40 Tab 0   ??? lisinopril (PRINIVIL, ZESTRIL) 20 mg tablet Take 20 mg by mouth two (2) times a day.         Past History     Past Medical History:  Past Medical History:   Diagnosis Date   ??? Hypertension    ??? Mitral regurgitation        Past Surgical History:  History reviewed. No pertinent surgical history.    Family History:  History reviewed. No pertinent family history.    Social History:  Social History     Tobacco Use   ??? Smoking status: Current Every Day Smoker     Packs/day: 0.25   ??? Smokeless tobacco: Never Used   Substance Use Topics   ??? Alcohol use: Yes   ??? Drug use: No       Allergies:  Allergies   Allergen Reactions   ??? Bee Venom Protein (Honey Bee) Anaphylaxis   ??? Ibuprofen Anaphylaxis   ??? Sulfa (Sulfonamide Antibiotics) Anaphylaxis    ??? Tylenol [Acetaminophen] Anaphylaxis   ??? Zofran [Ondansetron Hcl] Other (comments)     vomiting         Review of Systems     Review of Systems   Constitutional: Negative for chills and fever.   HENT: Positive for trouble swallowing.    Gastrointestinal: Positive for nausea and vomiting (blood present). Negative for blood in stool.   All other systems reviewed and are negative.        Physical Exam     Visit Vitals  BP (!) 168/96 (BP 1 Location: Left arm)   Pulse (!) 106   Temp 98.4 ??F (36.9 ??C)   Resp 16   Wt 56.6 kg (124 lb 11.2 oz)   SpO2 97%   BMI 22.81 kg/m??         Physical Exam:  .  Patient Vitals for the past 12 hrs:   Temp Pulse Resp BP SpO2   03/15/17 2002 98.4 ??F (36.9 ??C) (!) 106 16 (!) 168/96 97 %     Gen: Well developed, well nourished 36 y.o. female  HEENT: Normocephalic, atraumatic  Respiratory: No accessory muscle use No wheeze, No rales, No rhonchi. Normal chest wall excursion. No subcutaneous air, no rib crepitus  Cardiovascular: Regular rhythm and rate, Normal pulses, Normal perfusion. No edema  Gastrointestinal: Non distended, Non tender, No masses. No ascites. No organomegaly. No evidence of trauma  Musculoskeletal: Full range of motion at all other tested joints. No joint effusions.  Neuro: Normal strength, Normal sensation. Normal speech. No ataxia. Cranial Nerves II-XII normal as tested.  Skin: No rash, petechia or purpura. Warm and dry  Psyche: No suicidal ideation, No homicidal ideation. No hallucinations. Organized thoughts.  Heme: Normal  GU: Deferred      Diagnostic Study Results     Labs -  No results found for this or any previous visit (from the past 12 hour(s)).  Labs Reviewed   METABOLIC PANEL, COMPREHENSIVE - Abnormal; Notable for the following components:       Result Value    Potassium 3.0 (*)     Calcium 8.1 (*)     All other components within normal limits   ETHYL ALCOHOL - Abnormal; Notable for the following components:    ALCOHOL(ETHYL),SERUM 274 (*)      All other components within normal limits   CBC WITH AUTOMATED DIFF   PROTHROMBIN TIME + INR   PTT       Radiologic Studies -   No orders to display         Medical Decision Making   I am the first provider for this patient.    I reviewed the vital signs, available nursing notes, past medical history, past surgical history, family history and social history.    Vital Signs-Reviewed the patient's vital signs.        Records Reviewed: Nursing Notes and Old Medical Records (Time of Review: 8:29 PM)    ED Course: Progress Notes, Reevaluation, and Consults:  Pt had a NG lavage, discharge home for outpatient, follow up with PCP  Negative for blood. Refused rectal exam. Risks explained      Diagnosis     Diagnosis:   1. Hematemesis, presence of nausea not specified      There is no problem list on file for this patient.      Disposition:  Home or Self Care  03/16/2017  1:30 AM    Discharge Medications:   Discharge Medication List as of 03/16/2017  1:16 AM                  Medication List      ASK your doctor about these medications    acetaminophen 500 mg tablet  Commonly known as:  TYLENOL EXTRA STRENGTH  Take 2 Tabs by mouth every six (6) hours as needed for Pain.     lisinopril 20 mg tablet  Commonly known as:  PRINIVIL, ZESTRIL     ondansetron 4 mg disintegrating tablet  Commonly known as:  ZOFRAN ODT  Take 1 Tab by mouth every eight (8) hours as needed for Nausea.          _______________________________       Scribe Severiano Gilbert acting as a scribe for and in the presence of Lamon Rotundo, Philomena Course, MD      March 15, 2017 at 8:29 PM       Provider Attestation:      I personally performed the services described in the documentation, reviewed the documentation, as recorded by the scribe in  my presence, and it accurately and completely records my words and actions. March 15, 2017 at 8:29 PM - Anayia Eugene, Philomena Courseraig A, MD        _______________________________

## 2017-03-16 ENCOUNTER — Inpatient Hospital Stay
Admit: 2017-03-16 | Discharge: 2017-03-16 | Disposition: A | Discharge status: Home / Self Care | Payer: Self-pay | Attending: Emergency Medicine

## 2017-03-16 LAB — CBC WITH AUTOMATED DIFF
ABS. BASOPHILS: 0 10*3/uL (ref 0.0–0.1)
ABS. EOSINOPHILS: 0.1 10*3/uL (ref 0.0–0.4)
ABS. LYMPHOCYTES: 2.4 10*3/uL (ref 0.9–3.6)
ABS. MONOCYTES: 0.7 10*3/uL (ref 0.05–1.2)
ABS. NEUTROPHILS: 4 10*3/uL (ref 1.8–8.0)
BASOPHILS: 0 % (ref 0–2)
EOSINOPHILS: 2 % (ref 0–5)
HCT: 44.1 % (ref 35.0–45.0)
HGB: 15.6 g/dL (ref 12.0–16.0)
LYMPHOCYTES: 33 % (ref 21–52)
MCH: 31.8 PG (ref 24.0–34.0)
MCHC: 35.4 g/dL (ref 31.0–37.0)
MCV: 89.8 FL (ref 74.0–97.0)
MONOCYTES: 10 % (ref 3–10)
MPV: 9.6 FL (ref 9.2–11.8)
NEUTROPHILS: 55 % (ref 40–73)
PLATELET: 172 10*3/uL (ref 135–420)
RBC: 4.91 M/uL (ref 4.20–5.30)
RDW: 14 % (ref 11.6–14.5)
WBC: 7.3 10*3/uL (ref 4.6–13.2)

## 2017-03-16 LAB — METABOLIC PANEL, COMPREHENSIVE
A-G Ratio: 1 (ref 0.8–1.7)
ALT (SGPT): 26 U/L (ref 13–56)
AST (SGOT): 35 U/L (ref 15–37)
Albumin: 4 g/dL (ref 3.4–5.0)
Alk. phosphatase: 91 U/L (ref 45–117)
Anion gap: 8 mmol/L (ref 3.0–18)
BUN/Creatinine ratio: 13 (ref 12–20)
BUN: 8 MG/DL (ref 7.0–18)
Bilirubin, total: 0.5 MG/DL (ref 0.2–1.0)
CO2: 24 mmol/L (ref 21–32)
Calcium: 8.1 MG/DL — ABNORMAL LOW (ref 8.5–10.1)
Chloride: 106 mmol/L (ref 100–108)
Creatinine: 0.6 MG/DL (ref 0.6–1.3)
GFR est AA: >60 mL/min/{1.73_m2} (ref >60)
GFR est non-AA: >60 mL/min/{1.73_m2} (ref >60)
Globulin: 4 g/dL (ref 2.0–4.0)
Glucose: 74 mg/dL (ref 74–99)
Potassium: 3 mmol/L — ABNORMAL LOW (ref 3.5–5.5)
Protein, total: 8 g/dL (ref 6.4–8.2)
Sodium: 138 mmol/L (ref 136–145)

## 2017-03-16 LAB — PROTHROMBIN TIME + INR
INR: 1 (ref 0.8–1.2)
Prothrombin time: 12.7 s (ref 11.5–15.2)

## 2017-03-16 LAB — PTT: aPTT: 31.5 s (ref 23.0–36.4)

## 2017-03-16 LAB — ETHYL ALCOHOL: ALCOHOL(ETHYL),SERUM: 274 MG/DL — ABNORMAL HIGH (ref 0–3)

## 2017-03-16 MED ORDER — LIDOCAINE 2 % MUCOUS MEMBRANE JELLY IN APPLICATOR
2 % | Freq: Once | Status: DC
Start: 2017-03-16 — End: 2017-03-16

## 2017-03-16 MED ORDER — LIDOCAINE 2 % MUCOUS MEMBRANE JELLY IN APPLICATOR
2 % | Status: DC
Start: 2017-03-16 — End: 2017-03-16

## 2017-03-16 MED FILL — LIDOCAINE 2 % MUCOUS MEMBRANE JELLY IN APPLICATOR: 2 % | Qty: 10

## 2017-03-16 NOTE — ED Notes (Signed)
I have reviewed discharge instructions with the patient.  The patient verbalized understanding. Patient armband removed and given to patient to take home.  Patient was informed of the privacy risks if armband lost or stolen

## 2017-03-16 NOTE — ED Notes (Signed)
Pt given ice water per request.  MD Dates authorized.

## 2017-04-12 DIAGNOSIS — C189 Malignant neoplasm of colon, unspecified: Secondary | ICD-10-CM

## 2017-04-12 HISTORY — DX: Malignant neoplasm of colon, unspecified: C18.9

## 2017-07-16 ENCOUNTER — Emergency Department: Admit: 2017-07-16 | Primary: Family Medicine

## 2017-07-16 ENCOUNTER — Inpatient Hospital Stay
Admit: 2017-07-16 | Discharge: 2017-07-19 | Discharge status: Against Medical Advice | Attending: Family Medicine | Admitting: Family Medicine

## 2017-07-16 DIAGNOSIS — G43A0 Cyclical vomiting, not intractable: Principal | ICD-10-CM

## 2017-07-16 LAB — CBC WITH AUTOMATED DIFF
ABS. BASOPHILS: 0 10*3/uL (ref 0.0–0.1)
ABS. EOSINOPHILS: 0 10*3/uL (ref 0.0–0.5)
ABS. IMM. GRANS.: 0 10*3/uL (ref 0–0.03)
ABS. LYMPHOCYTES: 0.4 10*3/uL — ABNORMAL LOW (ref 1.2–3.7)
ABS. MONOCYTES: 0.4 10*3/uL (ref 0.2–0.8)
ABS. NEUTROPHILS: 2.8 10*3/uL (ref 1.6–6.1)
BASOPHILS: 1 % (ref 0–1.2)
EOSINOPHILS: 1 %
HCT: 41.2 % (ref 36.0–45.0)
HGB: 14.2 g/dL (ref 11.8–14.8)
IMMATURE GRANULOCYTES: 0 % (ref 0–0.4)
LYMPHOCYTES: 12 %
MCH: 33.4 PG — ABNORMAL HIGH (ref 25.6–32.2)
MCHC: 34.5 g/dL (ref 32.2–35.5)
MCV: 96.9 FL — ABNORMAL HIGH (ref 80.0–95.0)
MONOCYTES: 10 %
MPV: 11.5 FL (ref 9.4–12.4)
NEUTROPHILS: 76 %
PLATELET: 125 10*3/uL — ABNORMAL LOW (ref 150–400)
RBC: 4.25 M/uL (ref 3.93–5.22)
RDW: 14.4 % (ref 11.6–14.4)
WBC: 3.7 10*3/uL — ABNORMAL LOW (ref 4.0–10.0)

## 2017-07-16 LAB — URINE MICROSCOPIC WITH REFLEX CULTURE: Hyaline cast: NONE SEEN /lpf

## 2017-07-16 LAB — METABOLIC PANEL, BASIC
Anion gap: 10 mmol/L (ref 5–15)
BUN/Creatinine ratio: 17 (ref 12–20)
BUN: 10 MG/DL (ref 7–18)
CO2: 33 mmol/L — ABNORMAL HIGH (ref 21–32)
Calcium: 9.5 MG/DL (ref 8.5–10.1)
Chloride: 94 mmol/L — ABNORMAL LOW (ref 98–110)
Creatinine: 0.59 MG/DL (ref 0.55–1.02)
GFR est AA: >60 mL/min/{1.73_m2} (ref >60)
GFR est non-AA: >60 mL/min/{1.73_m2} (ref >60)
Glucose: 119 mg/dL — ABNORMAL HIGH (ref 70–100)
Potassium: 3.4 mmol/L — ABNORMAL LOW (ref 3.5–5.1)
Sodium: 137 mmol/L (ref 136–145)

## 2017-07-16 LAB — HEPATIC FUNCTION PANEL
A-G Ratio: 1 (ref 1.0–3.0)
ALT (SGPT): 247 U/L — ABNORMAL HIGH (ref 13–56)
AST (SGOT): 302 U/L — ABNORMAL HIGH (ref 15–37)
Albumin: 4.2 g/dL (ref 3.4–5.0)
Alk. phosphatase: 167 U/L — ABNORMAL HIGH (ref 45–117)
Bilirubin, direct: 0.51 mg/dL — ABNORMAL HIGH (ref 0.00–0.20)
Bilirubin, total: 1.01 mg/dL (ref 0.20–1.20)
Globulin: 4.2 g/dL — ABNORMAL HIGH (ref 2.5–3.5)
Protein, total: 8.4 g/dL — ABNORMAL HIGH (ref 6.4–8.2)

## 2017-07-16 LAB — UA WITH REFLEX MICRO AND CULTURE
Glucose: NEGATIVE mg/dL
Ketone: 40 mg/dL — AB
Nitrites: POSITIVE — AB
Protein: 100 mg/dL — AB
Specific gravity: 1.029 (ref 1.001–1.035)
Urobilinogen: 4 EU/dL — ABNORMAL HIGH (ref 0.2–1.0)
pH (UA): 7 (ref 5–8)

## 2017-07-16 LAB — HCG QL SERUM: HCG, Ql.: NEGATIVE

## 2017-07-16 LAB — ETHYL ALCOHOL: ALCOHOL(ETHYL),SERUM: 3.9 MG/DL — ABNORMAL HIGH (ref <3.0)

## 2017-07-16 LAB — LIPASE: Lipase: 120 U/L (ref 73–393)

## 2017-07-16 LAB — AMMONIA: Ammonia, plasma: 41 umol/L — ABNORMAL HIGH (ref 11–32)

## 2017-07-16 MED ORDER — SODIUM CHLORIDE 0.9 % IJ SYRG
Freq: Once | INTRAMUSCULAR | Status: DC
Start: 2017-07-16 — End: 2017-07-16

## 2017-07-16 MED ORDER — SODIUM CHLORIDE 0.9 % IV PIGGY BACK
1 gram | INTRAVENOUS | Status: AC
Start: 2017-07-16 — End: 2017-07-16
  Administered 2017-07-16: 22:00:00 via INTRAVENOUS

## 2017-07-16 MED ORDER — PROMETHAZINE 25 MG/ML INJECTION
25 mg/mL | Freq: Once | INTRAMUSCULAR | Status: AC
Start: 2017-07-16 — End: 2017-07-16
  Administered 2017-07-16: 20:00:00 via INTRAVENOUS

## 2017-07-16 MED ORDER — METOCLOPRAMIDE 5 MG/ML IJ SOLN
5 mg/mL | INTRAMUSCULAR | Status: AC
Start: 2017-07-16 — End: 2017-07-16
  Administered 2017-07-16: 23:00:00 via INTRAVENOUS

## 2017-07-16 MED ORDER — SODIUM CHLORIDE 0.9% BOLUS IV
0.9 % | Freq: Once | INTRAVENOUS | Status: AC
Start: 2017-07-16 — End: 2017-07-16
  Administered 2017-07-16: 20:00:00 via INTRAVENOUS

## 2017-07-16 MED ORDER — SODIUM CHLORIDE 0.9 % INJECTION
20 mg/2 mL | INTRAMUSCULAR | Status: AC
Start: 2017-07-16 — End: 2017-07-16
  Administered 2017-07-16: 22:00:00 via INTRAVENOUS

## 2017-07-16 MED ORDER — HYDROMORPHONE (PF) 1 MG/ML IJ SOLN
1 mg/mL | INTRAMUSCULAR | Status: DC
Start: 2017-07-16 — End: 2017-07-16
  Administered 2017-07-16: 23:00:00 via INTRAVENOUS

## 2017-07-16 MED ORDER — PROMETHAZINE 25 MG/ML INJECTION
25 mg/mL | Freq: Once | INTRAMUSCULAR | Status: AC
Start: 2017-07-16 — End: 2017-07-16
  Administered 2017-07-16: 22:00:00 via INTRAVENOUS

## 2017-07-16 MED ORDER — HYDROMORPHONE 0.5 MG/0.5 ML SYRINGE
0.5 mg/ mL | INTRAMUSCULAR | Status: AC
Start: 2017-07-16 — End: 2017-07-17

## 2017-07-16 MED FILL — HYDROMORPHONE 0.5 MG/0.5 ML SYRINGE: 0.5 mg/ mL | INTRAMUSCULAR | Qty: 0.5

## 2017-07-16 MED FILL — PROMETHAZINE 25 MG/ML INJECTION: 25 mg/mL | INTRAMUSCULAR | Qty: 1

## 2017-07-16 MED FILL — CEFTRIAXONE 1 GRAM SOLUTION FOR INJECTION: 1 gram | INTRAMUSCULAR | Qty: 1

## 2017-07-16 MED FILL — HYDROMORPHONE (PF) 1 MG/ML IJ SOLN: 1 mg/mL | INTRAMUSCULAR | Qty: 1

## 2017-07-16 MED FILL — SODIUM CHLORIDE 0.9 % IV: INTRAVENOUS | Qty: 1000

## 2017-07-16 MED FILL — NORMAL SALINE FLUSH 0.9 % INJECTION SYRINGE: INTRAMUSCULAR | Qty: 10

## 2017-07-16 MED FILL — METOCLOPRAMIDE 5 MG/ML IJ SOLN: 5 mg/mL | INTRAMUSCULAR | Qty: 2

## 2017-07-16 MED FILL — FAMOTIDINE (PF) 20 MG/2 ML IV: 20 mg/2 mL | INTRAVENOUS | Qty: 2

## 2017-07-16 NOTE — ED Notes (Signed)
'  cold', given additional warm blankets. Fiance in room

## 2017-07-16 NOTE — ED Notes (Signed)
Verbal shift change report given to jeff (Cabin crewoncoming nurse) by Misty Stanleylisa (offgoing nurse). Report included the following information SBAR, Kardex, ED Summary, Intake/Output, MAR and Recent Results.

## 2017-07-16 NOTE — Other (Signed)
TRANSFER - IN REPORT:    Verbal report received from Debbie, RN(name) on Coral ElseJennifer Madaris  being received from 5S(unit) for routine progression of care      Report consisted of patient???s Situation, Background, Assessment and   Recommendations(SBAR).     Information from the following report(s) SBAR was reviewed with the receiving nurse.    Opportunity for questions and clarification was provided.      Assessment completed upon patient???s arrival to unit and care assumed.

## 2017-07-16 NOTE — ED Notes (Addendum)
Did vomit ~ 75 cc clear vomit. [facility]

## 2017-07-16 NOTE — ED Notes (Signed)
Vomited small amt of bile.

## 2017-07-16 NOTE — ED Notes (Signed)
Pt states she has been vomiting daily since last sept. Has not seen a GI MD

## 2017-07-16 NOTE — ED Notes (Signed)
Lab made aware of add-on

## 2017-07-16 NOTE — H&P (Signed)
History & Physical     Primary Care Provider: None  Date of service: 07/16/17    History of Presenting Illness:     Vanessa Rosales is a 37 y.o. 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 Patient has had epigastric pain on and off since last December.  She was seen in December and told she had a gastric bleed and was treated with Protonix.  They are never seemed to be a gastric roll follow-up.  She then had nausea and vomiting multiple times this past winter and was told she had flu and than a viral illness.  For the past 1 week patient has had abdominal pain in epigastric area that is sharp and mild to severe.  It comes and goes.  She has been vomiting largely clear yellow fluid and is having trouble keeping liquids down.  Patient felt her abdomen was distended.  She has had some chills to go along with this but is worse when she is vomiting.  No fever, sweats, dysuria, hematuria, chest pain, shortness of breath, cough, sputum production, diarrhea, syncope, presyncope.  Over the last 3 months patient has had some bowel movement with stringy yellow weight material in it however this seems to coincide with when she had flu and viral illness.    ED course:  In the emergency room the patient received ceftriaxone, Dilaudid, Reglan, Phenergan, Pepcid, 1 L normal saline    Past Medical History:   Diagnosis Date   ??? Hypertension    ??? Mitral regurgitation       Past Surgical History:   Procedure Laterality Date   ??? HX HYSTEROSCOPY     ??? HX OTHER SURGICAL      foot surgery     Prior to Admission medications    Medication Sig Start Date End Date Taking? Authorizing Provider   lisinopril (PRINIVIL, ZESTRIL) 20 mg tablet Take 20 mg by mouth two (2) times a day.   Yes Other, Phys, MD   acetaminophen (TYLENOL EXTRA STRENGTH) 500 mg tablet Take 2 Tabs by mouth  every six (6) hours as needed for Pain. 10/20/16   Argie Ramming, MD     Allergies   Allergen Reactions   ??? Bee Venom Protein (Honey Bee) Anaphylaxis   ??? Ibuprofen Anaphylaxis   ??? Iodinated Contrast- Oral And Iv Dye Anaphylaxis   ??? Shellfish Derived Anaphylaxis   ??? Sulfa (Sulfonamide Antibiotics) Anaphylaxis   ??? Tylenol [Acetaminophen] Anaphylaxis   ??? Doxycycline Rash   ??? Zofran [Ondansetron Hcl] Other (comments)     vomiting      Family History   Problem Relation Age of Onset   ??? Hypertension Mother    ??? Arthritis Mother    ??? Alcohol abuse Father    ??? Kidney Disease Father         ESRD      Social History     Socioeconomic History   ??? Marital status: SINGLE     Spouse name: Not on file   ??? Number of children: Not on file   ??? Years of education: Not on file   ??? Highest education level: Not on file   Occupational History   ??? Not on file   Social Needs   ??? Financial resource strain: Not on file   ??? Food insecurity:     Worry: Not on file  Inability: Not on file   ??? Transportation needs:     Medical: Not on file     Non-medical: Not on file   Tobacco Use   ??? Smoking status: Current Every Day Smoker     Packs/day: 0.50     Years: 4.00     Pack years: 2.00   ??? Smokeless tobacco: Never Used   Substance and Sexual Activity   ??? Alcohol use: Yes     Frequency: Monthly or less     Comment: last drink supposedly 1 mo ago    ??? Drug use: No   ??? Sexual activity: Yes     Birth control/protection: None   Lifestyle   ??? Physical activity:     Days per week: Not on file     Minutes per session: Not on file   ??? Stress: Not on file   Relationships   ??? Social connections:     Talks on phone: Not on file     Gets together: Not on file     Attends religious service: Not on file     Active member of club or organization: Not on file     Attends meetings of clubs or organizations: Not on file     Relationship status: Not on file   ??? Intimate partner violence:     Fear of current or ex partner: Not on file      Emotionally abused: Not on file     Physically abused: Not on file     Forced sexual activity: Not on file   Other Topics Concern   ??? Not on file   Social History Narrative   ??? Not on file         REVIEW OF SYSTEMS:       _0  All systems reviewed and negative except as above in HPI      _1  Unable to obtain  ROS due to  _2 mental status change  _3 sedated   _4 intubated      Objective:     Physical Exam:     Visit Vitals  BP (!) 140/91 Comment: RR 16   Pulse 90 Comment: RR 16   Temp 98.2 ??F (36.8 ??C)   Resp 16   Ht _5  (1.575 m)   Wt 54.4 kg (120 lb)   SpO2 97% Comment: RR 16   BMI 21.95 kg/m??      O2 Device: Room air     General:  Patient alert and oriented, pleasant, no acute distress  Head:  Atraumatic, normocephalic  Eyes:  EOMI, no scleral injection, no icterus. PERRL.    Ears:  Symmetric without discharge  Nose:  Symmetric without discharge  Oropharynx:  Pink without erythema, exudate, tonsillar enlargement.  Mucous membranes moist.  Neck:  Trachea midline, no JVD  Cardiovascular:  Regular rate and rhythm, no murmurs rubs gallops or clicks.  Peripheral pulses palpable bilaterally. No edema.  Lungs:  Clear to auscultation bilaterally  Chest:  Symmetric with respirations  Abdomen:  Positive bowel sounds, soft, diffusely tender without focal finding, mild distention, no rigidity, masses, guarding, rebound.  Extremities:  No cyanosis or clubbing, no gross joint deformity.  Neuro: Cranial 2 through 12 are grossly intact. No gross focal deficits.        Clinical results:    Recent Labs     07/16/17  1545   WBC 3.7*   HGB 14.2   HCT 41.2   PLT 125*  Recent Labs     07/16/17  1545   NA 137   K 3.4*   CL 94*   CO2 33*   GLU 119*   BUN 10   CREA 0.59   CA 9.5   ALB 4.2   TBILI 1.01   SGOT 302*   ALT 247*         Recent Results (from the past 24 hour(s))   CBC WITH AUTOMATED DIFF    Collection Time: 07/16/17  3:45 PM   Result Value Ref Range    WBC 3.7 (L) 4.0 - 10.0 K/uL    RBC 4.25 3.93 - 5.22 M/uL     HGB 14.2 11.8 - 14.8 g/dL    HCT 41.2 36.0 - 45.0 %    MCV 96.9 (H) 80.0 - 95.0 FL    MCH 33.4 (H) 25.6 - 32.2 PG    MCHC 34.5 32.2 - 35.5 g/dL    RDW 14.4 11.6 - 14.4 %    PLATELET 125 (L) 150 - 400 K/uL    MPV 11.5 9.4 - 12.4 FL    NEUTROPHILS 76 %    LYMPHOCYTES 12 %    MONOCYTES 10 %    EOSINOPHILS 1 %    BASOPHILS 1 0 - 1.2 %    ABS. NEUTROPHILS 2.8 1.6 - 6.1 K/UL    ABS. LYMPHOCYTES 0.4 (L) 1.2 - 3.7 K/UL    ABS. MONOCYTES 0.4 0.2 - 0.8 K/UL    ABS. EOSINOPHILS 0.0 0.0 - 0.5 K/UL    ABS. BASOPHILS 0.0 0.0 - 0.1 K/UL    IMMATURE GRANULOCYTES 0 0 - 0.4 %    ABS. IMM. GRANS. 0.0 0 - 3.87 K/UL   METABOLIC PANEL, BASIC    Collection Time: 07/16/17  3:45 PM   Result Value Ref Range    Sodium 137 136 - 145 mmol/L    Potassium 3.4 (L) 3.5 - 5.1 mmol/L    Chloride 94 (L) 98 - 110 mmol/L    CO2 33 (H) 21 - 32 mmol/L    Anion gap 10 5 - 15 mmol/L    Glucose 119 (H) 70 - 100 mg/dL    BUN 10 7 - 18 MG/DL    Creatinine 0.59 0.55 - 1.02 MG/DL    BUN/Creatinine ratio 17 12 - 20    GFR est AA >60 >60 ml/min/1.58m    GFR est non-AA >60 >60 ml/min/1.779m   Calcium 9.5 8.5 - 10.1 MG/DL   HEPATIC FUNCTION PANEL    Collection Time: 07/16/17  3:45 PM   Result Value Ref Range    Albumin 4.2 3.4 - 5.0 g/dL    Alk. phosphatase 167 (H) 45 - 117 U/L    ALT (SGPT) 247 (H) 13 - 56 U/L    AST (SGOT) 302 (H) 15 - 37 U/L    Bilirubin, direct 0.51 (H) 0.00 - 0.20 mg/dL    Bilirubin, total 1.01 0.20 - 1.20 mg/dL    Protein, total 8.4 (H) 6.4 - 8.2 g/dL    Globulin 4.2 (H) 2.5 - 3.5 g/dL    A-G Ratio 1.0 1.0 - 3.0     LIPASE    Collection Time: 07/16/17  3:45 PM   Result Value Ref Range    Lipase 120 73 - 393 U/L   AMMONIA    Collection Time: 07/16/17  3:45 PM   Result Value Ref Range    Ammonia 41 (H) 11 - 32 UMOL/L   HCG QL  SERUM    Collection Time: 07/16/17  3:45 PM   Result Value Ref Range    HCG, Ql. NEGATIVE  NEGATIVE   ETHYL ALCOHOL    Collection Time: 07/16/17  3:45 PM   Result Value Ref Range     ALCOHOL(ETHYL),SERUM 3.9 (H) <3.0 MG/DL   UA WITH REFLEX MICRO AND CULTURE    Collection Time: 07/16/17  5:00 PM   Result Value Ref Range    Color DARK YELLOW (A) YELLOW    Appearance TURBID (A) CLEAR    Specific gravity 1.029 1.001 - 1.035    pH (UA) 7.0 5 - 8    Protein 100 (A) NEGATIVE mg/dL    Glucose NEGATIVE  NEGATIVE mg/dL    Ketone 40 (A) NEGATIVE mg/dL    Bilirubin MODERATE (A) NEGATIVE    Blood TRACE (A) NEGATIVE    Urobilinogen 4.0 (H) 0.2 - 1.0 EU/dL    Nitrites POSITIVE (A) NEGATIVE    Leukocyte Esterase MODERATE (A) NEGATIVE   URINE MICROSCOPIC WITH REFLEX CULTURE    Collection Time: 07/16/17  5:00 PM   Result Value Ref Range    WBC 10-20 <5 /hpf    RBC 0-4 <5 /hpf    Bacteria 4+ (A) NONE SEEN /hpf    UA:UC IF INDICATED URINE CULTURE ORDERED     Epithelial cells 0-4  SQUAMOUS   <5 /hpf    Hyaline cast NONE SEEN /lpf       Imaging:     CT scan abdomen pelvis: dilated small bowel loops left upper quadrant with focalization, ileus versus enteritis versus small-bowel obstruction.          Assessment and Plan:     1.  Possible SBO:  Differential includes gastritis versus enteritis, potential ileus.  Patient with nausea vomiting epigastric pain. No previous surgeries.  Unable to tolerate p.o..  -diet as tolerated  -IV fluids  -Dilaudid p.r.n.  -Phenergan p.r.n.  -Protonix for potential gastritis    2.   Transaminitis:  Patient denies drinking alcohol on a week despite slightly elevated alcohol level.  Will obtain HIV and hep C testing due to history of IV drug abuse.  Last use was approximately 1 year ago.    3.   Positive urinalysis:  Patient without any urinary symptoms was given ceftriaxone in the ED.  Follow up culture.  I do not see indication for further antibiotics    DVT Prophylaxis:  SCDs and encouraged ambulation    Disposition:  Admit to medical floor    Code Status:   Full code      Signed By: Ayesha Rumpf, DO     July 16, 2017

## 2017-07-16 NOTE — Other (Signed)
TRANSFER - IN REPORT:    Verbal report received from  [place]Minn[place]rey (name) on Coral ElseJennifer Berberich  being received from ED (unit) for routine progression of care      Report consisted of patient???s Situation, Background, Assessment and   Recommendations(SBAR).     Information from the following report(s) ED Summary, Procedure Summary and Recent Results was reviewed with the receiving nurse.    Opportunity for questions and clarification was provided.      Assessment completed upon patient???s arrival to unit and care assumed.

## 2017-07-16 NOTE — ED Notes (Signed)
Denies carrying any drugs/weapons/paraphernalia

## 2017-07-16 NOTE — ED Notes (Signed)
Pt taking B/P cuff and sat monitor off.

## 2017-07-16 NOTE — ED Triage Notes (Signed)
Prior visits reviewed with pt, pt states she has never been here. Medical record and SS reviewed with admitting, all information correct.

## 2017-07-16 NOTE — ED Notes (Addendum)
Pt now stating she is allergic to shellfish and IVP dye. Peter dias PAC to be updated.

## 2017-07-16 NOTE — ED Provider Notes (Signed)
37 year old female presents via private vehicle.  Chief complaint is epigastric abdominal pain.  This has been going on for a week, but recurrent and frequent since the fall of last year.  She states she has a history of IV drug abuse but has not used in almost a year.  She denies any history of hepatitis.  She also has a history of alcohol abuse but states she has been sober from alcohol for many months.  Over the past 1 week, the patient states she has not been able to keep anything down, no food or beverage for a week.  She has become run down and generally weakened.    The history is provided by the patient.   Vomiting    This is a new problem. There has been no fever. Associated symptoms include abdominal pain. Pertinent negatives include no chills, no fever, no diarrhea, no cough and no URI.        Past Medical History:   Diagnosis Date   ??? Hypertension    ??? Mitral regurgitation        No past surgical history on file.      No family history on file.    Social History     Socioeconomic History   ??? Marital status: SINGLE     Spouse name: Not on file   ??? Number of children: Not on file   ??? Years of education: Not on file   ??? Highest education level: Not on file   Occupational History   ??? Not on file   Social Needs   ??? Financial resource strain: Not on file   ??? Food insecurity:     Worry: Not on file     Inability: Not on file   ??? Transportation needs:     Medical: Not on file     Non-medical: Not on file   Tobacco Use   ??? Smoking status: Current Every Day Smoker     Packs/day: 0.25   ??? Smokeless tobacco: Never Used   Substance and Sexual Activity   ??? Alcohol use: Yes   ??? Drug use: No   ??? Sexual activity: Not on file   Lifestyle   ??? Physical activity:     Days per week: Not on file     Minutes per session: Not on file   ??? Stress: Not on file   Relationships   ??? Social connections:     Talks on phone: Not on file     Gets together: Not on file     Attends religious service: Not on file      Active member of club or organization: Not on file     Attends meetings of clubs or organizations: Not on file     Relationship status: Not on file   ??? Intimate partner violence:     Fear of current or ex partner: Not on file     Emotionally abused: Not on file     Physically abused: Not on file     Forced sexual activity: Not on file   Other Topics Concern   ??? Not on file   Social History Narrative   ??? Not on file         ALLERGIES: Bee venom protein (honey bee); Ibuprofen; Sulfa (sulfonamide antibiotics); Tylenol [acetaminophen]; Doxycycline; and Zofran [ondansetron hcl]    Review of Systems   Constitutional: Negative for chills and fever.   Respiratory: Negative for cough.    Gastrointestinal:  Positive for abdominal pain and vomiting. Negative for diarrhea.   All other systems reviewed and are negative.      There were no vitals filed for this visit.         Physical Exam   Constitutional: She is oriented to person, place, and time. She appears well-developed and well-nourished.   HENT:   Head: Normocephalic and atraumatic.   Right Ear: External ear normal.   Left Ear: External ear normal.   Mouth/Throat: No oropharyngeal exudate.   Eyes: Pupils are equal, round, and reactive to light. EOM are normal. No scleral icterus.   Neck: Normal range of motion. Neck supple.   Cardiovascular: Normal rate, regular rhythm and normal heart sounds. Exam reveals no gallop and no friction rub.   No murmur heard.  Pulmonary/Chest: Effort normal and breath sounds normal. No respiratory distress. She has no wheezes. She exhibits no tenderness.   Abdominal: Soft. Bowel sounds are normal. There is tenderness. There is no guarding.   Musculoskeletal: Normal range of motion. She exhibits no edema, tenderness or deformity.   Lymphadenopathy:     She has no cervical adenopathy.   Neurological: She is alert and oriented to person, place, and time. She has normal reflexes.   Skin: Skin is warm and dry. No rash noted.    Psychiatric: She has a normal mood and affect. Thought content normal.   Nursing note and vitals reviewed.       MDM  Number of Diagnoses or Management Options  Diagnosis management comments: 37 year old female presents with a one-week history of abdominal pain. This appears to be recurrent, chronic.  She has had vomiting for the past 1 week.  She states she cannot keep anything down.  On exam, the patient appears to be uncomfortable, but in no acute distress.  There is mid epigastric to general upper abdominal tenderness. She states she has a history of IV drug abuse but denies any history of liver disease, hepatitis, or cirrhosis.  She is also a chronic alcohol user, denying any alcohol use recently but her alcohol level is mildly elevated here the patient was given Phenergan, IV fluids and is feeling better.  She states the nausea is is not gone completely.  She will be given another dose of Phenergan as well as Pepcid.  CT is pending.       Amount and/or Complexity of Data Reviewed  Decide to obtain previous medical records or to obtain history from someone other than the patient: yes      ED Course as of Jul 16 1836   Sat Jul 16, 2017   1719 Alk. phosphatase(!): 167 [BL]   1720 ALT (SGPT)(!): 247 [BL]   1720 AST(!): 302 [BL]   1720 Bilirubin, direct(!): 0.51 [BL]   1720 Bilirubin, total: 1.01 [BL]   1730 This patient presents complaining of abdominal pain and vomiting. She has not been able to keep anything down for a week.  She then describes that this has been going on actually for much longer, perhaps since last fall.  Frequent bouts of this abdominal pain with vomiting.    [PD]   844 37 year old female presents with abdominal pain, nausea vomiting. She states this has been going on for about a week and half but then states this is quite recurrent, going on for almost a year.  On exam, the patient does appear to be quite uncomfortable, but not in acute distress.  Abdomen  is tender.  CT of the abdomen  shows dilated small bowel loops left upper quadrant with focalization, ileus versus enteritis versus small-bowel obstruction.  Labs demonstrate hepatitis with elevated he transaminases.  Lipase is not elevated.  She denies alcohol use but does have a scant amount of alcohol in her blood, the with blood alcohol at 3.9.  Urinalysis shows incidental UTI.  The patient has been given IV fluids, Rocephin 1 g IV.  She was given 2 doses of Phenergan for her nausea and vomiting with good results.  She was also given Pepcid 20 mg IV.  This case will be discussed with the hospitalist.  The patient will be admitted.    [PD]      ED Course User Index  [BL] Audrie Lia., DO  [PD] Pixie Casino., PA-C       Procedures

## 2017-07-16 NOTE — ED Notes (Signed)
Pt resting no needs

## 2017-07-16 NOTE — ED Notes (Signed)
Given a warm blanket and mouth swabs

## 2017-07-17 LAB — DRUG ABUSE SCREEN URINE
6-Acetylmorphone, urine: NEGATIVE
AMPHETAMINES: NEGATIVE
BARBITURATES: NEGATIVE
BENZODIAZEPINES: NEGATIVE
Buprenorphine screen, urine: NEGATIVE
COCAINE: NEGATIVE
Creatinine, urine (drug screen): 278 mg/dL
EDDP, urine: NEGATIVE
FENTANYL: NEGATIVE
OPIATES: NEGATIVE
OXYCODONE: NEGATIVE
pH, urine (drug screen): 6

## 2017-07-17 MED ORDER — NALOXONE 0.4 MG/ML INJECTION
0.4 mg/mL | INTRAMUSCULAR | Status: DC | PRN
Start: 2017-07-17 — End: 2017-07-19

## 2017-07-17 MED ORDER — SODIUM CHLORIDE 0.9 % IJ SYRG
Freq: Two times a day (BID) | INTRAMUSCULAR | Status: DC
Start: 2017-07-17 — End: 2017-07-19
  Administered 2017-07-17 – 2017-07-19 (×5): via INTRAVENOUS

## 2017-07-17 MED ORDER — SODIUM CHLORIDE 0.9 % INJECTION
25 mg/mL | Freq: Once | INTRAMUSCULAR | Status: AC
Start: 2017-07-17 — End: 2017-07-17
  Administered 2017-07-17: 07:00:00 via INTRAVENOUS

## 2017-07-17 MED ORDER — SODIUM CHLORIDE 0.9% BOLUS IV
0.9 % | Freq: Once | INTRAVENOUS | Status: AC
Start: 2017-07-17 — End: 2017-07-17
  Administered 2017-07-17: 04:00:00 via INTRAVENOUS

## 2017-07-17 MED ORDER — NICOTINE 7 MG/24 HR DAILY PATCH
7 mg/24 hr | Freq: Every day | TRANSDERMAL | Status: DC
Start: 2017-07-17 — End: 2017-07-19

## 2017-07-17 MED ORDER — PROMETHAZINE 25 MG/ML INJECTION
25 mg/mL | Freq: Four times a day (QID) | INTRAMUSCULAR | Status: DC | PRN
Start: 2017-07-17 — End: 2017-07-19
  Administered 2017-07-17 – 2017-07-18 (×6): via INTRAVENOUS

## 2017-07-17 MED ORDER — HYDROMORPHONE 0.5 MG/0.5 ML SYRINGE
0.5 mg/ mL | INTRAMUSCULAR | Status: DC | PRN
Start: 2017-07-17 — End: 2017-07-19
  Administered 2017-07-17 – 2017-07-19 (×10): via INTRAVENOUS

## 2017-07-17 MED ORDER — LISINOPRIL 20 MG TAB
20 mg | Freq: Two times a day (BID) | ORAL | Status: DC
Start: 2017-07-17 — End: 2017-07-19

## 2017-07-17 MED ORDER — PROMETHAZINE 25 MG TAB
25 mg | Freq: Four times a day (QID) | ORAL | Status: DC | PRN
Start: 2017-07-17 — End: 2017-07-17
  Administered 2017-07-17: 06:00:00 via ORAL

## 2017-07-17 MED ORDER — SODIUM CHLORIDE 0.9 % IJ SYRG
INTRAMUSCULAR | Status: DC | PRN
Start: 2017-07-17 — End: 2017-07-19

## 2017-07-17 MED ORDER — SODIUM CHLORIDE 0.9 % IV
INTRAVENOUS | Status: DC
Start: 2017-07-17 — End: 2017-07-19
  Administered 2017-07-17 – 2017-07-19 (×6): via INTRAVENOUS

## 2017-07-17 MED ORDER — PANTOPRAZOLE 40 MG IV SOLR
40 mg | Freq: Every day | INTRAVENOUS | Status: DC
Start: 2017-07-17 — End: 2017-07-19
  Administered 2017-07-17 – 2017-07-18 (×2): via INTRAVENOUS

## 2017-07-17 MED FILL — HYDROMORPHONE 0.5 MG/0.5 ML SYRINGE: 0.5 mg/ mL | INTRAMUSCULAR | Qty: 0.5

## 2017-07-17 MED FILL — PROTONIX 40 MG INTRAVENOUS SOLUTION: 40 mg | INTRAVENOUS | Qty: 40

## 2017-07-17 MED FILL — PROMETHAZINE 25 MG/ML INJECTION: 25 mg/mL | INTRAMUSCULAR | Qty: 1

## 2017-07-17 MED FILL — SODIUM CHLORIDE 0.9 % IV: INTRAVENOUS | Qty: 1000

## 2017-07-17 MED FILL — NICOTINE 7 MG/24 HR DAILY PATCH: 7 mg/24 hr | TRANSDERMAL | Qty: 1

## 2017-07-17 MED FILL — NORMAL SALINE FLUSH 0.9 % INJECTION SYRINGE: INTRAMUSCULAR | Qty: 10

## 2017-07-17 MED FILL — PROMETHAZINE 25 MG TAB: 25 mg | ORAL | Qty: 1

## 2017-07-17 MED FILL — LISINOPRIL 20 MG TAB: 20 mg | ORAL | Qty: 1

## 2017-07-17 NOTE — Consults (Signed)
GASTROENTEROLOGY CONSULTATION    Referring provider: Dr. Bradd Burner.    NAME:  Vanessa Rosales   DOB:   January 05, 1981   MRN:   [id number]       Consult Date: 07/17/2017 10:38 AM        History of Present Illness:  Patient is a 37 y.o. who is seen in consultation for recurrent nausea, vomiting and abdominal pain. Patient says she was opioid rehab for 3 months last year and after she was discharged started developing recurrent episodes of nausea, vomiting and abdominal pain. Patient moved about 3 weeks to this area and started another episode. She uses Cannabis products recently: not totally clear how long. Also uses alcohol. Says was tested for communicable diseases while in rehab.  PMH:  Past Medical History:   Diagnosis Date   ??? Hypertension    ??? Mitral regurgitation        Patient Active Problem List   Diagnosis Code   ??? SBO (small bowel obstruction) (HCC) K56.609       PSH:  Past Surgical History:   Procedure Laterality Date   ??? HX HYSTEROSCOPY     ??? HX OTHER SURGICAL      foot surgery       Allergies:  Allergies   Allergen Reactions   ??? Bee Venom Protein (Honey Bee) Anaphylaxis   ??? Ibuprofen Anaphylaxis   ??? Iodinated Contrast- Oral And Iv Dye Anaphylaxis   ??? Shellfish Derived Anaphylaxis   ??? Sulfa (Sulfonamide Antibiotics) Anaphylaxis   ??? Tylenol [Acetaminophen] Anaphylaxis   ??? Doxycycline Rash   ??? Zofran [Ondansetron Hcl] Other (comments)     vomiting       Home Medications:  Prior to Admission Medications   Prescriptions Last Dose Informant Patient Reported? Taking?   acetaminophen (TYLENOL EXTRA STRENGTH) 500 mg tablet Not Taking at Unknown time  No No   Sig: Take 2 Tabs by mouth every six (6) hours as needed for Pain.   lisinopril (PRINIVIL, ZESTRIL) 20 mg tablet   Yes Yes   Sig: Take 20 mg by mouth two (2) times a day.      Facility-Administered Medications: None       Hospital Medications:  Current Facility-Administered Medications   Medication Dose Route Frequency   ??? promethazine (PHENERGAN) with saline injection  12.5 mg  12.5 mg IntraVENous Q6H PRN   ??? lisinopril (PRINIVIL, ZESTRIL) tablet 20 mg  20 mg Oral BID   ??? sodium chloride (NS) flush 3-10 mL  3-10 mL IntraVENous Q12H   ??? sodium chloride (NS) flush 3-10 mL  3-10 mL IntraVENous PRN   ??? naloxone (NARCAN) injection 0.4 mg  0.4 mg IntraVENous PRN   ??? HYDROmorphone (DILAUDID) syringe 0.5 mg  0.5 mg IntraVENous Q4H PRN   ??? 0.9% sodium chloride infusion  150 mL/hr IntraVENous CONTINUOUS   ??? pantoprazole (PROTONIX) 40 mg in sodium chloride 0.9% 10 mL injection  40 mg IntraVENous DAILY       Social History:  Social History     Tobacco Use   ??? Smoking status: Current Every Day Smoker     Packs/day: 0.50     Years: 4.00     Pack years: 2.00   ??? Smokeless tobacco: Never Used   Substance Use Topics   ??? Alcohol use: Yes     Frequency: Monthly or less     Comment: last drink supposedly 1 mo ago        Family History:  Family History  Problem Relation Age of Onset   ??? Hypertension Mother    ??? Arthritis Mother    ??? Alcohol abuse Father    ??? Kidney Disease Father         ESRD       Review of Systems: (negative unless otherwise indicated)        Objective:     Patient Vitals for the past 8 hrs:   BP Temp Pulse Resp   07/17/17 0535 126/82 98.5 ??F (36.9 ??C) 80 16       EXAM:   General: No acute distress.   Psych: No agitation.  Abdomen: soft and no guarding or rigidity.      Data Review          Recent Labs     07/16/17  1545   WBC 3.7*   HGB 14.2   HCT 41.2   PLT 125*     Recent Labs     07/16/17  1545   NA 137   K 3.4*   CL 94*   CO2 33*   BUN 10   CREA 0.59   GLU 119*   CA 9.5     Recent Labs     07/16/17  1545   SGOT 302*   AP 167*   TP 8.4*   ALB 4.2   GLOB 4.2*   LPSE 120   ALT 247*     No results for input(s): INR, PTP, APTT in the last 72 hours.    No lab exists for component: INREXT      Radiology Studies:  No results found. Preliminary results noted.       Assessment/Plan:   Counseling dominant 55 minute visit with more than 50% time spent on counseling about diagnosis,  treatment and follow up recommendations about nausea, vomiting and abdominal pain. Cannabis hyperemesis syndrome is high on the differential and I discussed the presentation pathophysiology and treatment in detail. Patient reports stabbing epigastric pain and so I recommend an upper endoscopy for evaluation and there is also question about melena although not likely. Elevated AST/ALT are likely due recent alcohol use. Abstinence from alcohol and Cannabis products stressed.      Signed By: Dessie ComaSrikrishna Nassir Neidert, MD     07/17/2017  10:38 AM     Copy to referring provider:Chander.

## 2017-07-17 NOTE — Progress Notes (Signed)
Hospitalist Progress Note    NAME:  Vanessa Rosales   DOB:   Apr 09, 1981   MRN:   [id number]     Date of Service: 07/17/2017      Subjective:     Complains of nausea and vomiting.  Unable to hold down p.o.  No fevers, no chills  Does have epigastric pain and discomfort  Nursing reports that there was a empty bottle of fire ball risky that was found in her belongings by security.  She denies consuming alcohol after being admitted.  Also complains of some dark color stools  She denies any dysuria or increased urinary frequency    Objective:   Vitals:  Visit Vitals  BP 126/82 (BP 1 Location: Left arm, BP Patient Position: At rest;Supine;Head of bed elevated (Comment degrees))   Pulse 80   Temp 98.5 ??F (36.9 ??C)   Resp 16   Ht 5\' 2"  (1.575 m)   Wt 55.6 kg (122 lb 9.6 oz)   SpO2 97% Comment: RR 16   BMI 22.42 kg/m??     O2 Device: Room air     Temp (24hrs), Avg:98.5 ??F (36.9 ??C), Min:98.2 ??F (36.8 ??C), Max:98.7 ??F (37.1 ??C)      Last 24hr Input/Output:    Intake/Output Summary (Last 24 hours) at 07/17/2017 1118  Last data filed at 07/16/2017 1900  Gross per 24 hour   Intake 1050 ml   Output ???   Net 1050 ml        PHYSICAL EXAM:    Physical Exam   HENT:   Head: Normocephalic and atraumatic.   Cardiovascular: Normal rate, regular rhythm and normal heart sounds.   Pulmonary/Chest: Effort normal and breath sounds normal. No respiratory distress. She has no wheezes. She has no rales.   Abdominal: Soft. Bowel sounds are normal. She exhibits no distension. There is tenderness (Epigastric). There is no rebound.   Musculoskeletal: She exhibits no edema.          Lab Data Reviewed:    No components found for: [facility]     Recent Labs     07/16/17  1545   WBC 3.7*   HGB 14.2   HCT 41.2   PLT 125*        Recent Labs     07/16/17  1545   NA 137   K 3.4*   CL 94*   CO2 33*   BUN 10   CREA 0.59   GLU 119*   CA 9.5   ALB 4.2   AP 167*   ALT 247*   SGOT 302*   CBIL 0.51*   TBILI 1.01        No results for input(s): INR in the last 72 hours.     No lab exists for component: INREXT    No results found for: CPK, CK, CKMMB, CKMB, RCK3, CKMBT, CKNDX, CKND1, MYO, TROPT, TROIQ, TROI, TROPT, TNIPOC, BNP, BNPP     No results found for: CHOL, CHOLX, CHLST, CHOLV, HDL, LDL, LDLC, DLDLP, TGLX, TRIGL, TRIGP, CHHD, CHHDX    All Micro Results     Procedure Component Value Units Date/Time    CULTURE, URINE [[id number]]  (Abnormal) Collected:  07/16/17 1700    Order Status:  Completed Specimen:  Urine Updated:  07/17/17 0920     Special Requests: --     NO SPECIAL REQUESTS  Reflexed from [id number]       Culture result: >100,000 CFU/mL  ENTERIC GRAM NEGATIVE RODS  IDENTIFICATION  AND SUSCEPTIBILITY TO FOLLOW            No results found.           Assessment/Plan:     37 year old female with history of IV drug use (heroin) she claims last use was approximately 1 year ago, found to have slightly elevated alcohol level, who presented with epigastric pain nausea and vomiting.    1.  Epigastric pain/nausea/vomiting:  Appears patient has been using cannabis and hyperemesis related to cannabis is high on the differential.  The patient does have some epigastric pain with elevated liver function tests.  GI will be consulted for further evaluation and possible EGD.  I suspect the elevated LFTs AST greater than ALT are likely due to alcohol use.  She was counseled on abstinence from alcohol and cannabis.  Urine tox is negative   continue protonix 40 mg daily  Phenergan for nausea    2.  Transaminitis:  Likely due to alcohol use  Will obtain right upper quadrant ultrasound  Check hepatitis panel    3.  Abnormal urinalysis:  Patient does not have any symptoms of urinary tract infection.  Will hold on treatment as there is no indication for antibiotics      DVT Prophylaxis:  [] Lovenox  [] Coumadin  [] Hep SQ  [x] SCD???s   GI Prophylaxis:   [x] H2B/PPI  Disposition:  [x] Home w/ Family   [] HH PT,OT,RN   [] SNF/LTC   [] SAH/Rehab      *Medications reviewed:   Current Facility-Administered Medications   Medication Dose Route Frequency   ??? promethazine (PHENERGAN) with saline injection 12.5 mg  12.5 mg IntraVENous Q6H PRN   ??? lisinopril (PRINIVIL, ZESTRIL) tablet 20 mg  20 mg Oral BID   ??? sodium chloride (NS) flush 3-10 mL  3-10 mL IntraVENous Q12H   ??? sodium chloride (NS) flush 3-10 mL  3-10 mL IntraVENous PRN   ??? naloxone (NARCAN) injection 0.4 mg  0.4 mg IntraVENous PRN   ??? HYDROmorphone (DILAUDID) syringe 0.5 mg  0.5 mg IntraVENous Q4H PRN   ??? 0.9% sodium chloride infusion  150 mL/hr IntraVENous CONTINUOUS   ??? pantoprazole (PROTONIX) 40 mg in sodium chloride 0.9% 10 mL injection  40 mg IntraVENous DAILY              Total time spent with patient:     minutes    [] Critical Care time:    minutes    Care Plan discussed with:    [x] Patient   [] Family    [] Care Manager    [x] Nursing   [x] Consultant/Specialist :      []   >50% of visit spent in counseling and coordination of care   (Discussed [] CODE status,  [] Care Plan, [] D/C Planning)        ___________________________________________________    Hospitalist: Roma SchanzAmit Ladawna Walgren, MD     ___________________________________________________

## 2017-07-17 NOTE — Consults (Signed)
GASTROENTEROLOGY CONSULTATION    Referring provider: Dr. Bradd Burnerhander.    NAME:  Vanessa Rosales   DOB:   Dec 19, 1980   MRN:   [id number]       Consult Date: 07/17/2017 10:38 AM        History of Present Illness:  Patient is a 37 y.o. who is seen in consultation for recurrent nausea, vomiting and abdominal pain. Patient says she was opioid rehab for 3 months last year and after she was discharged started developing recurrent episodes of nausea, vomiting and abdominal pain. Patient moved about 3 weeks to this area and started another episode. She uses Cannabis products recently: not totally clear how long. Also uses alcohol. Says was tested for communicable diseases while in rehab.  PMH:  Past Medical History:   Diagnosis Date   ??? Hypertension    ??? Mitral regurgitation        Patient Active Problem List   Diagnosis Code   ??? SBO (small bowel obstruction) (HCC) K56.609       PSH:  Past Surgical History:   Procedure Laterality Date   ??? HX HYSTEROSCOPY     ??? HX OTHER SURGICAL      foot surgery       Allergies:  Allergies   Allergen Reactions   ??? Bee Venom Protein (Honey Bee) Anaphylaxis   ??? Ibuprofen Anaphylaxis   ??? Iodinated Contrast- Oral And Iv Dye Anaphylaxis   ??? Shellfish Derived Anaphylaxis   ??? Sulfa (Sulfonamide Antibiotics) Anaphylaxis   ??? Tylenol [Acetaminophen] Anaphylaxis   ??? Doxycycline Rash   ??? Zofran [Ondansetron Hcl] Other (comments)     vomiting       Home Medications:  Prior to Admission Medications   Prescriptions Last Dose Informant Patient Reported? Taking?   acetaminophen (TYLENOL EXTRA STRENGTH) 500 mg tablet Not Taking at Unknown time  No No   Sig: Take 2 Tabs by mouth every six (6) hours as needed for Pain.   lisinopril (PRINIVIL, ZESTRIL) 20 mg tablet   Yes Yes   Sig: Take 20 mg by mouth two (2) times a day.      Facility-Administered Medications: None       Hospital Medications:  Current Facility-Administered Medications   Medication Dose Route Frequency    ??? promethazine (PHENERGAN) with saline injection 12.5 mg  12.5 mg IntraVENous Q6H PRN   ??? lisinopril (PRINIVIL, ZESTRIL) tablet 20 mg  20 mg Oral BID   ??? sodium chloride (NS) flush 3-10 mL  3-10 mL IntraVENous Q12H   ??? sodium chloride (NS) flush 3-10 mL  3-10 mL IntraVENous PRN   ??? naloxone (NARCAN) injection 0.4 mg  0.4 mg IntraVENous PRN   ??? HYDROmorphone (DILAUDID) syringe 0.5 mg  0.5 mg IntraVENous Q4H PRN   ??? 0.9% sodium chloride infusion  150 mL/hr IntraVENous CONTINUOUS   ??? pantoprazole (PROTONIX) 40 mg in sodium chloride 0.9% 10 mL injection  40 mg IntraVENous DAILY       Social History:  Social History     Tobacco Use   ??? Smoking status: Current Every Day Smoker     Packs/day: 0.50     Years: 4.00     Pack years: 2.00   ??? Smokeless tobacco: Never Used   Substance Use Topics   ??? Alcohol use: Yes     Frequency: Monthly or less     Comment: last drink supposedly 1 mo ago        Family History:  Family History  Problem Relation Age of Onset   ??? Hypertension Mother    ??? Arthritis Mother    ??? Alcohol abuse Father    ??? Kidney Disease Father         ESRD       Review of Systems: (negative unless otherwise indicated)        Objective:     Patient Vitals for the past 8 hrs:   BP Temp Pulse Resp   07/17/17 0535 126/82 98.5 ??F (36.9 ??C) 80 16       EXAM:   General: No acute distress.   Psych: No agitation.  Abdomen: soft and no guarding or rigidity.      Data Review          Recent Labs     07/16/17  1545   WBC 3.7*   HGB 14.2   HCT 41.2   PLT 125*     Recent Labs     07/16/17  1545   NA 137   K 3.4*   CL 94*   CO2 33*   BUN 10   CREA 0.59   GLU 119*   CA 9.5     Recent Labs     07/16/17  1545   SGOT 302*   AP 167*   TP 8.4*   ALB 4.2   GLOB 4.2*   LPSE 120   ALT 247*     No results for input(s): INR, PTP, APTT in the last 72 hours.    No lab exists for component: INREXT      Radiology Studies:  No results found. Preliminary results noted.       Assessment/Plan:    Counseling dominant 55 minute visit with more than 50% time spent on counseling about diagnosis, treatment and follow up recommendations about nausea, vomiting and abdominal pain. Cannabis hyperemesis syndrome is high on the differential and I discussed the presentation pathophysiology and treatment in detail. Patient reports stabbing epigastric pain and so I recommend an upper endoscopy for evaluation and there is also question about melena although not likely. Elevated AST/ALT are likely due recent alcohol use. Abstinence from alcohol and Cannabis products stressed.      Signed By: Dessie Coma, MD     07/17/2017  10:38 AM     Copy to referring provider:Chander.

## 2017-07-17 NOTE — Progress Notes (Signed)
Pt. Welcoming. Family present. Comfort and blessing.

## 2017-07-17 NOTE — Progress Notes (Signed)
Per report from the prior nurse, pt was refusing to have her belongings verified with staff upon admission and was going through her pockets looking for her "personal belongings." given her history of IV drug use, security was called to assist with searching pts belongings, where an  empty bottle of Fireball whisky was found.  Prior to security entering her room the patient had disabled the bed alarm and was going through her belongings.  This nurse asked the patient if she had drunk the whisky after admission, she denied daily alcohol consumption.  Will monitor for signs of substance use or withdrawal.

## 2017-07-18 ENCOUNTER — Inpatient Hospital Stay: Admit: 2017-07-18 | Primary: Family Medicine

## 2017-07-18 LAB — CULTURE, URINE: Culture result:: >100000 — AB

## 2017-07-18 LAB — METABOLIC PANEL, BASIC
Anion gap: 15 mmol/L (ref 5–15)
BUN/Creatinine ratio: 7 — ABNORMAL LOW (ref 12–20)
BUN: 3 MG/DL — ABNORMAL LOW (ref 7–18)
CO2: 22 mmol/L (ref 21–32)
Calcium: 8.5 MG/DL (ref 8.5–10.1)
Chloride: 96 mmol/L — ABNORMAL LOW (ref 98–110)
Creatinine: 0.46 MG/DL — ABNORMAL LOW (ref 0.55–1.02)
GFR est AA: >60 mL/min/{1.73_m2} (ref >60)
GFR est non-AA: >60 mL/min/{1.73_m2} (ref >60)
Glucose: 72 mg/dL (ref 70–100)
Potassium: 2.8 mmol/L — CL (ref 3.5–5.1)
Sodium: 133 mmol/L — ABNORMAL LOW (ref 136–145)

## 2017-07-18 LAB — CBC WITH AUTOMATED DIFF
ABS. BASOPHILS: 0 10*3/uL (ref 0.0–0.1)
ABS. EOSINOPHILS: 0.1 10*3/uL (ref 0.0–0.5)
ABS. IMM. GRANS.: 0 10*3/uL (ref 0–0.03)
ABS. LYMPHOCYTES: 1 10*3/uL — ABNORMAL LOW (ref 1.2–3.7)
ABS. MONOCYTES: 0.5 10*3/uL (ref 0.2–0.8)
ABS. NEUTROPHILS: 2.4 10*3/uL (ref 1.6–6.1)
BASOPHILS: 1 % (ref 0–1.2)
EOSINOPHILS: 2 %
HCT: 40.1 % (ref 36.0–45.0)
HGB: 13.6 g/dL (ref 11.8–14.8)
IMMATURE GRANULOCYTES: 0 % (ref 0–0.4)
LYMPHOCYTES: 25 %
MCH: 33.1 PG — ABNORMAL HIGH (ref 25.6–32.2)
MCHC: 33.9 g/dL (ref 32.2–35.5)
MCV: 97.6 FL — ABNORMAL HIGH (ref 80.0–95.0)
MONOCYTES: 12 %
MPV: 11.1 FL (ref 9.4–12.4)
NEUTROPHILS: 60 %
PLATELET: 85 10*3/uL — ABNORMAL LOW (ref 150–400)
RBC COMMENTS: NORMAL
RBC: 4.11 M/uL (ref 3.93–5.22)
RDW: 13.5 % (ref 11.6–14.4)
WBC: 4.1 10*3/uL (ref 4.0–10.0)

## 2017-07-18 LAB — HEPATIC FUNCTION PANEL
A-G Ratio: 1 (ref 1.0–3.0)
ALT (SGPT): 186 U/L — ABNORMAL HIGH (ref 13–56)
AST (SGOT): 262 U/L — ABNORMAL HIGH (ref 15–37)
Albumin: 3.5 g/dL (ref 3.4–5.0)
Alk. phosphatase: 142 U/L — ABNORMAL HIGH (ref 45–117)
Bilirubin, direct: 0.87 mg/dL — ABNORMAL HIGH (ref 0.00–0.20)
Bilirubin, total: 1.86 mg/dL — ABNORMAL HIGH (ref 0.20–1.20)
Globulin: 3.5 g/dL (ref 2.5–3.5)
Protein, total: 7 g/dL (ref 6.4–8.2)

## 2017-07-18 LAB — POTASSIUM: Potassium: 2.9 mmol/L — CL (ref 3.5–5.1)

## 2017-07-18 MED ORDER — POTASSIUM CHLORIDE 10 MEQ/100 ML IV PIGGY BACK
10 mEq/0 mL | INTRAVENOUS | Status: AC
Start: 2017-07-18 — End: 2017-07-18
  Administered 2017-07-18 (×3): via INTRAVENOUS

## 2017-07-18 MED ORDER — POTASSIUM CHLORIDE 10 MEQ/100 ML IV PIGGY BACK
10 mEq/0 mL | Freq: Once | INTRAVENOUS | Status: DC
Start: 2017-07-18 — End: 2017-07-18

## 2017-07-18 MED ORDER — POTASSIUM CHLORIDE 10 MEQ/100 ML IV PIGGY BACK
10 mEq/0 mL | Freq: Once | INTRAVENOUS | Status: AC
Start: 2017-07-18 — End: 2017-07-18
  Administered 2017-07-18: 21:00:00 via INTRAVENOUS

## 2017-07-18 MED FILL — PROTONIX 40 MG INTRAVENOUS SOLUTION: 40 mg | INTRAVENOUS | Qty: 40

## 2017-07-18 MED FILL — HYDROMORPHONE 0.5 MG/0.5 ML SYRINGE: 0.5 mg/ mL | INTRAMUSCULAR | Qty: 0.5

## 2017-07-18 MED FILL — POTASSIUM CHLORIDE 10 MEQ/100 ML IV PIGGY BACK: 10 mEq/0 mL | INTRAVENOUS | Qty: 100

## 2017-07-18 MED FILL — NICOTINE 7 MG/24 HR DAILY PATCH: 7 mg/24 hr | TRANSDERMAL | Qty: 1

## 2017-07-18 MED FILL — PROMETHAZINE 25 MG/ML INJECTION: 25 mg/mL | INTRAMUSCULAR | Qty: 1

## 2017-07-18 MED FILL — SODIUM CHLORIDE 0.9 % INJECTION: INTRAMUSCULAR | Qty: 10

## 2017-07-18 MED FILL — LISINOPRIL 20 MG TAB: 20 mg | ORAL | Qty: 1

## 2017-07-18 NOTE — Progress Notes (Signed)
SW attempted to meet with patient to discuss preliminary discharge planning but patient was sleeping. SW will try again later today.

## 2017-07-18 NOTE — Progress Notes (Signed)
Gastrointestinal Progress Note    07/18/2017    Admit Date: 07/16/2017    Subjective:   Continues to report significant pain in upper abdomen that improves with meds.  Reports 2 episodes of vomiting overnight.  States last BM 1 week ago.  Denies flatus.  Receiving IV potassium.      Current Facility-Administered Medications   Medication Dose Route Frequency   ??? promethazine (PHENERGAN) with saline injection 12.5 mg  12.5 mg IntraVENous Q6H PRN   ??? nicotine (NICODERM CQ) 7 mg/24 hr patch 1 Patch  1 Patch TransDERmal DAILY   ??? lisinopril (PRINIVIL, ZESTRIL) tablet 20 mg  20 mg Oral BID   ??? sodium chloride (NS) flush 3-10 mL  3-10 mL IntraVENous Q12H   ??? sodium chloride (NS) flush 3-10 mL  3-10 mL IntraVENous PRN   ??? naloxone (NARCAN) injection 0.4 mg  0.4 mg IntraVENous PRN   ??? HYDROmorphone (DILAUDID) syringe 0.5 mg  0.5 mg IntraVENous Q4H PRN   ??? 0.9% sodium chloride infusion  150 mL/hr IntraVENous CONTINUOUS   ??? pantoprazole (PROTONIX) 40 mg in sodium chloride 0.9% 10 mL injection  40 mg IntraVENous DAILY        Objective:     Blood pressure (!) 157/105, pulse 78, temperature 98.2 ??F (36.8 ??C), resp. rate 18, height 5\' 2"  (1.575 m), weight 55.6 kg (122 lb 9.6 oz), SpO2 98 %.    No intake/output data recorded.    04/06 1901 - 04/08 0700  In: 3447.5 [P.O.:120; I.V.:3327.5]  Out: 401 [Urine:400]    EXAM:   Gen: Well appearing female in NAD. A&Ox3  Abd: Soft, flat, mild upper abdominal tenderness without guarding/rebound, +BS     Data Review      Recent Results (from the past 24 hour(s))   CBC WITH AUTOMATED DIFF    Collection Time: 07/18/17  8:01 AM   Result Value Ref Range    WBC 4.1 4.0 - 10.0 K/uL    RBC 4.11 3.93 - 5.22 M/uL    HGB 13.6 11.8 - 14.8 g/dL    HCT 95.240.1 84.136.0 - 32.445.0 %    MCV 97.6 (H) 80.0 - 95.0 FL    MCH 33.1 (H) 25.6 - 32.2 PG    MCHC 33.9 32.2 - 35.5 g/dL    RDW 40.113.5 02.711.6 - 25.314.4 %    PLATELET 85 (L) 150 - 400 K/uL    MPV 11.1 9.4 - 12.4 FL    NEUTROPHILS 60 %    LYMPHOCYTES 25 %    MONOCYTES 12 %     EOSINOPHILS 2 %    BASOPHILS 1 0 - 1.2 %    ABS. NEUTROPHILS 2.4 1.6 - 6.1 K/UL    IMMATURE GRANULOCYTES 0 0 - 0.4 %    ABS. LYMPHOCYTES 1.0 (L) 1.2 - 3.7 K/UL    ABS. MONOCYTES 0.5 0.2 - 0.8 K/UL    ABS. EOSINOPHILS 0.1 0.0 - 0.5 K/UL    ABS. BASOPHILS 0.0 0.0 - 0.1 K/UL    ABS. IMM. GRANS. 0.0 0 - 0.03 K/UL    RBC COMMENTS NORMAL     PLATELET COMMENTS MODERATE  DECREASED      METABOLIC PANEL, BASIC    Collection Time: 07/18/17  8:01 AM   Result Value Ref Range    Sodium 133 (L) 136 - 145 mmol/L    Potassium 2.8 (LL) 3.5 - 5.1 mmol/L    Chloride 96 (L) 98 - 110 mmol/L    CO2 22 21 -  32 mmol/L    Anion gap 15 5 - 15 mmol/L    Glucose 72 70 - 100 mg/dL    BUN 3 (L) 7 - 18 MG/DL    Creatinine 1.91 (L) 0.55 - 1.02 MG/DL    BUN/Creatinine ratio 7 (L) 12 - 20    GFR est AA >60 >60 ml/min/1.70m2    GFR est non-AA >60 >60 ml/min/1.97m2    Calcium 8.5 8.5 - 10.1 MG/DL     Ultrasound Impression  No gallstones, gb wall thickening, or ductal dilation.  Slightly enlarged echogenic liver.      Assessment and Plan:   1. 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  EGD today (given K corrected) to rule out peptic ulcer disease/gastric outlet obstruction.  Exam not consistent with intestinal obstruction.      2. Transaminitis: Suspect related to alcohol use.  No evidence of gallstones/ductal dilation on imaging.  Alcohol abstinence.      Will review with Dr. Pasty Spillers

## 2017-07-18 NOTE — Progress Notes (Signed)
Hospitalist Progress Note    NAME:  Vanessa Rosales   DOB:   1980/07/08   MRN:   [id number]     Date of Service: 07/18/2017      Subjective:     Did not tolerate any po;  Nauseas yesterday.  No fevers, no chills  Still has abdominal pain  No chest pain, no shortness of breaht.    Objective:   Vitals:  Visit Vitals  BP 156/90 (BP 1 Location: Right arm, BP Patient Position: At rest;Supine)   Pulse 71   Temp 98.8 ??F (37.1 ??C)   Resp 18   Ht 5\' 2"  (1.575 m)   Wt 55.6 kg (122 lb 9.6 oz)   SpO2 98%   BMI 22.42 kg/m??     O2 Device: Room air     Temp (24hrs), Avg:98.8 ??F (37.1 ??C), Min:98.5 ??F (36.9 ??C), Max:99 ??F (37.2 ??C)      Last 24hr Input/Output:    Intake/Output Summary (Last 24 hours) at 07/18/2017 1025  Last data filed at 07/18/2017 0022  Gross per 24 hour   Intake 3447.5 ml   Output 400 ml   Net 3047.5 ml        PHYSICAL EXAM:    Physical Exam   HENT:   Head: Normocephalic and atraumatic.   Cardiovascular: Normal rate, regular rhythm and normal heart sounds.   Pulmonary/Chest: Effort normal and breath sounds normal. No respiratory distress. She has no wheezes. She has no rales.   Abdominal: Soft. Bowel sounds are normal. She exhibits no distension. There is tenderness (Epigastric). There is no rebound.   Musculoskeletal: She exhibits no edema.          Lab Data Reviewed:    No components found for: [facility]     Recent Labs     07/18/17  0801 07/16/17  1545   WBC 4.1 3.7*   HGB 13.6 14.2   HCT 40.1 41.2   PLT 85* 125*        Recent Labs     07/18/17  0801 07/16/17  1545   NA 133* 137   K 2.8* 3.4*   CL 96* 94*   CO2 22 33*   BUN 3* 10   CREA 0.46* 0.59   GLU 72 119*   CA 8.5 9.5   ALB  --  4.2   AP  --  167*   ALT  --  247*   SGOT  --  302*   CBIL  --  0.51*   TBILI  --  1.01        No results for input(s): INR in the last 72 hours.    No lab exists for component: INREXT, INREXT    No results found for: CPK, CK, CKMMB, CKMB, RCK3, CKMBT, CKNDX, CKND1, MYO, TROPT, TROIQ, TROI, TROPT, TNIPOC, BNP, BNPP      No results found for: CHOL, CHOLX, CHLST, CHOLV, HDL, LDL, LDLC, DLDLP, TGLX, TRIGL, TRIGP, CHHD, CHHDX    All Micro Results     Procedure Component Value Units Date/Time    CULTURE, URINE [[id number]]  (Abnormal)  (Susceptibility) Collected:  07/16/17 1700    Order Status:  Completed Specimen:  Urine Updated:  07/18/17 0930     Special Requests: --     NO SPECIAL REQUESTS  Reflexed from [id number]       Culture result: >100,000 CFU/mL ESCHERICHIA COLI          No results found.  Assessment/Plan:     37 year old female with history of IV drug use (heroin) she claims last use was approximately 1 year ago, found to have slightly elevated alcohol level, who presented with epigastric pain nausea and vomiting.    1.  Epigastric pain/nausea/vomiting:   suspect cyclic nausea/vomiting from cannabis induced hyperemesis  EGD today to further eval    2.  Transaminitis:  Likely due to alcohol use  US pending  Check hepatitis panel    3.  Abnormal urinalysis:  Patient does not have any symptoms of urinary tract infection.  Will hold on treatment as there is no indication for antibiotics    4.  Hypokalemia  From vomiting  Replace K  K>3 for EGD    DVT Prophylaxis:  [] Lovenox  [] Coumadin  [] Hep SQ  [x] SCD???s   GI Prophylaxis:   [x] H2B/PPI  Disposition:  [x] Home w/ Family   [] HH PT,OT,RN   [] SNF/LTC   [] SAH/Rehab      *Medications reviewed:  Current Facility-Administered Medications   Medication Dose Route Frequency   ??? potassium chloride 10 mEq in 100 ml IVPB  10 mEq IntraVENous Q1H   ??? promethazine (PHENERGAN) with saline injection 12.5 mg  12.5 mg IntraVENous Q6H PRN   ??? nicotine (NICODERM CQ) 7 mg/24 hr patch 1 Patch  1 Patch TransDERmal DAILY   ??? lisinopril (PRINIVIL, ZESTRIL) tablet 20 mg  20 mg Oral BID   ??? sodium chloride (NS) flush 3-10 mL  3-10 mL IntraVENous Q12H   ??? sodium chloride (NS) flush 3-10 mL  3-10 mL IntraVENous PRN   ??? naloxone (NARCAN) injection 0.4 mg  0.4 mg IntraVENous PRN    ??? HYDROmorphone (DILAUDID) syringe 0.5 mg  0.5 mg IntraVENous Q4H PRN   ??? 0.9% sodium chloride infusion  150 mL/hr IntraVENous CONTINUOUS   ??? pantoprazole (PROTONIX) 40 mg in sodium chloride 0.9% 10 mL injection  40 mg IntraVENous DAILY              Total time spent with patient:     minutes    [] Critical Care time:    minutes    Care Plan discussed with:    [x] Patient   [] Family    [] Care Manager    [x] Nursing   [x] Consultant/Specialist :      []   >50% of visit spent in counseling and coordination of care   (Discussed [] CODE status,  [] Care Plan, [] D/C Planning)        ___________________________________________________    Hospitalist: Roma SchanzAmit Churchill Grimsley, MD     ___________________________________________________

## 2017-07-18 NOTE — Progress Notes (Signed)
SW met with patient to discuss discharge planning. Patient reports that she lives with her boyfriend in an apartment building. She denies difficulty with ambulating and is able to climb stairs without a problem. Patient is independent with ADL's including personal grooming, light housekeeping, and meal prep. She reports difficulty accessing food and SW provided a list of food pantries in the [place] area. Patient does not drive and relies on her boyfriend for transportation. Patient does not have Medicaid as she believes is lapsed so she would like assistance with reapplying. Does not anticipate needs upon discharge.     Care Management Interventions  Current Support Network: Lives with Spouse  Plan discussed with Pt/Family/Caregiver: Yes  Discharge Location  Discharge Placement: Home

## 2017-07-18 NOTE — Progress Notes (Signed)
Pt is 37 y/o Female admitted to 5S from ED secondary to complications from small bowel obstruction (SBO). Pt complaining of abdominal pain rating it 8/10 upon admission, pain fell to 3/10 after administration of dilaudid.  Pt is on a clear liquid until midnight then NPO diet until endoscopy consult which is scheduled for 07/19/17.    Pt has a hx of IV drug abuse.    Allergies: Ibuprofen, Bees, Acetaminophen, Sage, Iodine, Shellfish, Sulfa.      Neurological assessment found patient to be oriented to self, place, time, and situation, PERRLA, makes needs known, states that she is having?3/10 pain in abdomen.    ?  Facial exam found symmetry of face, no bruising behind the ears, pt has own teeth, uses reading glasses.    Airway assessment found patent airway, equal rise and fall of the chest, lung sounds clear in all fields, no use of accessory muscles to breathe, O2 98% on room air. No tracheal shift.    Cardiac assessment found pulses present in all extremities, HR of 74, normal rhythm, cap refill <3 seconds  of 156/82. No JVD found.     Abdomen was soft, tender,  slight distention, had rebound tenderness, bowel sounds were present in all four quadrants, pt reports 3/10 pain in abdomen, pain increases with palpation.     Skin assessment showed pt was pink, warm,?dry, pt had PIV in L arm, site was clean and clear, IV was patent.    ?  Musculoskeletal assessment found no injuries to head, neck, or face. Pt had no deformities or distracting injuries, AROM in all four extremities, equal grip strength. Pt ambulates ab lib.    Psychological assessment found pt to be feeling anxious  about health, pt voiced slight annoyance with situation, mainly due to not being able to eat r/t scheduled endoscopy.     Safety assessment found bed in lowest, locked position, rails X2, call light within reach.    Vital signs and assessment documented, meds given per MAR.

## 2017-07-18 NOTE — Progress Notes (Signed)
@  2340: Pt requested to have IV line removed d/t discomfort and stated "I cannot tolerate this IV placement". This nurse removed the IV from L. Antecubital. There were 2 unsuccessful attempts made for IV acess. The pt then stated ???I???ll just get another IV tomorrow???. [facility] and importance of IV placement. Pt educated IV Dilaudid and Phenergan will not be an option without IV access. Patient hesitated then stated ???I 'm able to get Dilaudid in another way, right????.     Pt removed/refused tele @2349  stating ???I don???t think I need this, I???ll wear it again tomorrow??? and ???can you try placing another IV???? . Dr. Moses MannersAgosto informed     Raleigh LionsLois RN gained IV access in Left forearm

## 2017-07-18 NOTE — Progress Notes (Signed)
[facility], [place]  CLINICAL NUTRITION    NUTRITION ASSESSMENT  Type of Assessment:[x]  Initial    []  Follow Up  Initial Referral From:   []  MD    [x]   RN    []  Patient Request    []  Nutrition Screen     Reason for Initial Referral:   [x]  MST    []  Pressure Injury/Skin Breakdown    []  Poor Appetite/PO    []  Diet Ed  []  Other:     Current Diet Order: Clear liquids  Food Allergies: Shellfish, Sage    Anthropometrics:Height: 5\' 2"  (157.5 cm)      Admit Weight: 54.4 kg (120 lb)   Most Recent Weight: 55.6 kg (122 lb 9.6 oz) s/p hydration  Body mass index is 22.42 kg/m??.     Patient Vitals for the past 168 hrs:   Weight   07/16/17 2230 55.6 kg (122 lb 9.6 oz)   07/16/17 1515 54.4 kg (120 lb)        IBW RANGE: 45.9 - 61.9 kg  % IBW: 100%  UBW: 130-135# per pt  % UBW: 90-94%  Adjusted wt: n/a  Weight History: Pt reports weight loss started in 03/2017 when GI issues started. Pt reports losing 13# (from 135# to 122#) over the last 4 months. However Epic shows 124# during 03/15/17 ED admission and likely the weight loss had already occurred. It appears weight has been relatively stable x 4 months despite ongoing GI distress.     Estimated Needs: based on [x]  Current wt   []   IBW   []  UBW   []  Adjusted wt  Calories: 1375-1650 kcal (25 - 30 kcals/kg)   Protein: 55-70 g (1.0 - 1.25 g/kg)  Fluid: 1375-1925 ml (25 - 35 ml/kg)  Pertinent Medications: Reviewed [x]      Labs:  Recent Labs     07/18/17  0801 07/16/17  1545   NA 133* 137   K 2.8* 3.4*   CL 96* 94*   CO2 22 33*   GLU 72 119*   BUN 3* 10   CREA 0.46* 0.59   CA 8.5 9.5       No results found for: HBA1C, HBA1CEXT    No results for input(s): GLUCPOC in the last 72 hours.       PMH: Reviewed [x]  includes HTN, GI bleed, active smoker, EtOH abuse, IV drug abuse hx (sober x 1 year)      Meal & Supplement Intake: NPO/Clear liquids x 2 days with negligible intake  No data found.    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       INTERVENTION & PLAN   Food and/or Nutrient Delivery  ?? As medically able: start Regular diet order    Nutrition Education  ??  Educated pt on role of dietitian and plan to provide diet/nutrition education prn pending results of EGD. Discussed with pt need for improve nutrition status overall once oral diet is allowed and discussed the use of Oral Nutritional Supplement (ONS) when the time comes.     Coordination of Nutrition Care  ?? n/a      Assessment Summary: 37 yo F with recurrent GI issues x 4 months, now admitted with the same and scheduled for EGD. 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     (4/8) Visually pt looks very well nourished, but she report poor PO x 2 weeks and extremely poor/not even taking liquids x 1 week PTA. Pt states when GI distress started in 03/2017 she would self restrict a certain food group x 1 week and monitor response in attempt to find food best tolerated or food causing the reaction. Ultimately pt reported no clear trigger foods, but tolerated vegetables the best up until the last 2 weeks. 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       Goal(s):  1. Start oral diet intake within the next 1-3 days  2. >50% intake from meals    MONITORING & EVALUATION    Nutrition Follow-up Date: 07/20/17    Priority Level of Care: [x]  High []  Moderate []  Low

## 2017-07-19 LAB — HEPATITIS C ANTIBODY: HCV Ab: NONREACTIVE

## 2017-07-19 LAB — HIV 1/2 ANTIGEN/ANTIBODY, FOURTH GENERATION W/RFL: HIV 1/2 Ab Interpretation: NONREACTIVE

## 2017-07-19 LAB — METABOLIC PANEL, BASIC
Anion gap: 14 mmol/L (ref 5–15)
BUN/Creatinine ratio: 4 — ABNORMAL LOW (ref 12–20)
BUN: 2 MG/DL — ABNORMAL LOW (ref 7–18)
CO2: 23 mmol/L (ref 21–32)
Calcium: 8.5 MG/DL (ref 8.5–10.1)
Chloride: 99 mmol/L (ref 98–110)
Creatinine: 0.5 MG/DL — ABNORMAL LOW (ref 0.55–1.02)
GFR est AA: >60 mL/min/{1.73_m2} (ref >60)
GFR est non-AA: >60 mL/min/{1.73_m2} (ref >60)
Glucose: 82 mg/dL (ref 70–100)
Potassium: 2.9 mmol/L — CL (ref 3.5–5.1)
Sodium: 136 mmol/L (ref 136–145)

## 2017-07-19 LAB — HIV 1/2 AG/AB, 4TH GENERATION,W RFLX CONFIRM: HIV 1/2 AB INTERPRETATION: NONREACTIVE

## 2017-07-19 LAB — CBC WITH AUTOMATED DIFF
ABS. BASOPHILS: 0 10*3/uL (ref 0.0–0.1)
ABS. EOSINOPHILS: 0.2 10*3/uL (ref 0.0–0.5)
ABS. IMM. GRANS.: 0 10*3/uL (ref 0–0.03)
ABS. LYMPHOCYTES: 1.4 10*3/uL (ref 1.2–3.7)
ABS. MONOCYTES: 0.6 10*3/uL (ref 0.2–0.8)
ABS. NEUTROPHILS: 2.2 10*3/uL (ref 1.6–6.1)
BASOPHILS: 1 % (ref 0–1.2)
EOSINOPHILS: 4 %
HCT: 40.8 % (ref 36.0–45.0)
HGB: 14.1 g/dL (ref 11.8–14.8)
IMMATURE GRANULOCYTES: 0 % (ref 0–0.4)
LYMPHOCYTES: 33 %
MCH: 33.1 PG — ABNORMAL HIGH (ref 25.6–32.2)
MCHC: 34.6 g/dL (ref 32.2–35.5)
MCV: 95.8 FL — ABNORMAL HIGH (ref 80.0–95.0)
MONOCYTES: 13 %
MPV: 11.3 FL (ref 9.4–12.4)
NEUTROPHILS: 49 %
PLATELET: 99 10*3/uL — ABNORMAL LOW (ref 150–400)
RBC: 4.26 M/uL (ref 3.93–5.22)
RDW: 13.3 % (ref 11.6–14.4)
WBC: 4.4 10*3/uL (ref 4.0–10.0)

## 2017-07-19 LAB — HCV AB: Hepatitis C virus Ab: NONREACTIVE

## 2017-07-19 LAB — HEP B SURFACE AG: Hep B Surface Ag: NONREACTIVE

## 2017-07-19 MED ORDER — POTASSIUM CHLORIDE 10 MEQ/100 ML IV PIGGY BACK
10 mEq/0 mL | Freq: Once | INTRAVENOUS | Status: DC
Start: 2017-07-19 — End: 2017-07-19

## 2017-07-19 MED ORDER — POTASSIUM CHLORIDE SR 20 MEQ TAB, PARTICLES/CRYSTALS
20 mEq | ORAL | Status: DC
Start: 2017-07-19 — End: 2017-07-19

## 2017-07-19 MED FILL — POTASSIUM CHLORIDE 10 MEQ/100 ML IV PIGGY BACK: 10 mEq/0 mL | INTRAVENOUS | Qty: 100

## 2017-07-19 MED FILL — HYDROMORPHONE 0.5 MG/0.5 ML SYRINGE: 0.5 mg/ mL | INTRAMUSCULAR | Qty: 0.5

## 2017-07-19 MED FILL — POTASSIUM CHLORIDE SR 20 MEQ TAB, PARTICLES/CRYSTALS: 20 mEq | ORAL | Qty: 2

## 2017-07-19 MED FILL — PROTONIX 40 MG INTRAVENOUS SOLUTION: 40 mg | INTRAVENOUS | Qty: 40

## 2017-07-19 NOTE — Discharge Summary (Signed)
Hospitalist Discharge Summary  ??  ??  Name:  Coral ElseJennifer Nebel     DOB:  08/26/1980    MRM:  [id number]  ????  Admit Date: 07/16/2017   ??  Discharge Date:  07/19/2017   ??  Primary Care Provider: None  ??  ??  Consultants:   1.  Gastroenterology:  Dr. Pasty SpillersNagri      ??  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      PAST MEDICAL HISTORY:  Past Medical History:   Diagnosis Date   ??? Hypertension    ??? Mitral regurgitation    h/o IVDA      PAST SURGICAL HISTORY:  Past Surgical History:   Procedure Laterality Date   ??? HX HYSTEROSCOPY     ??? HX OTHER SURGICAL      foot surgery       DISCHARGE MEDS:  Discharge Medication List as of 07/19/2017  8:15 AM          ALLERGIES:  Allergies   Allergen Reactions   ??? Bee Venom Protein (Honey Bee) Anaphylaxis   ??? Ibuprofen Anaphylaxis   ??? Iodinated Contrast- Oral And Iv Dye Anaphylaxis   ??? Sage Not Reported This Time   ??? Shellfish Derived Anaphylaxis   ??? Sulfa (Sulfonamide Antibiotics) Anaphylaxis   ??? Tylenol [Acetaminophen] Anaphylaxis   ??? Doxycycline Rash   ??? Zofran [Ondansetron Hcl] Other (comments)     vomiting       Code Status  Full Code        ADMISSION HISTORY & HOSPTIAL COURSE:    37 year old female with history of IV drug use (heroin) she claims last use was approximately 1 year ago, found to have slightly elevated alcohol level, who presented with epigastric pain nausea and vomiting.  ??  1.  Epigastric pain/nausea/vomiting:   suspect cyclic nausea/vomiting from cannabis induced hyperemesis.  Patient was seen and evaluated by Gastroenterology and EGD was scheduled for a July 18, 2017.  But due to significant hypokalemia EGD was delayed until 07/19/2017.  Despite having aggressively replaced her potassium her potassium levels remained low.     It was felt that her hypokalemia was likely related to her nausea vomiting.  Also hypomagnesium could not be excluded as  patient decided to sign out against medical advice prior to further investigation.  I received a phone call from nursing that patient were to sign out against medical advice at 8 a.m..  I arrived on the floor at 8:15 am and patient had already signed out against medical advice prior to talk to me.  Per nursing patient could not wait to talk to a physician prior to signing out against medical advice.    2.  Transaminitis:  Likely due to alcohol use  [place] results pending  ??  3.  Abnormal urinalysis:  Patient does not have any symptoms of urinary tract infection.  Will hold on treatment as there is no indication for antibiotics  ??  4.  Hypokalemia  From vomiting  Replace K  K>3 for EGD      ??    ??  Vitals:??   ??  BP (!) 158/98 (BP 1 Location: Left arm, BP Patient Position: At rest)    Pulse 78    Temp 97.4 ??F (36.3 ??C)    Resp 18    Ht 5\' 2"  (1.575  m)    Wt 55.6 kg (122 lb 9.6 oz)    SpO2 98%    BMI 22.42 kg/m??   O2 Device: Room air     ??  Physical Exam:??   ??  Physical Exam  Not examined prior to signing AMA; as patient did not wait to speak to provider  ??  Lab Review:??   ??  Recent Results (from the past 24 hour(s))   POTASSIUM    Collection Time: 07/18/17  2:43 PM   Result Value Ref Range    Potassium 2.9 (LL) 3.5 - 5.1 mmol/L   CBC WITH AUTOMATED DIFF    Collection Time: 07/19/17  5:07 AM   Result Value Ref Range    WBC 4.4 4.0 - 10.0 K/uL    RBC 4.26 3.93 - 5.22 M/uL    HGB 14.1 11.8 - 14.8 g/dL    HCT 04.5 40.9 - 81.1 %    MCV 95.8 (H) 80.0 - 95.0 FL    MCH 33.1 (H) 25.6 - 32.2 PG    MCHC 34.6 32.2 - 35.5 g/dL    RDW 91.4 78.2 - 95.6 %    PLATELET 99 (L) 150 - 400 K/uL    MPV 11.3 9.4 - 12.4 FL    NEUTROPHILS 49 %    LYMPHOCYTES 33 %    MONOCYTES 13 %    EOSINOPHILS 4 %    BASOPHILS 1 0 - 1.2 %    ABS. NEUTROPHILS 2.2 1.6 - 6.1 K/UL    ABS. LYMPHOCYTES 1.4 1.2 - 3.7 K/UL    ABS. MONOCYTES 0.6 0.2 - 0.8 K/UL    ABS. EOSINOPHILS 0.2 0.0 - 0.5 K/UL    ABS. BASOPHILS 0.0 0.0 - 0.1 K/UL    IMMATURE GRANULOCYTES 0 0 - 0.4 %     ABS. IMM. GRANS. 0.0 0 - 0.03 K/UL   METABOLIC PANEL, BASIC    Collection Time: 07/19/17  5:07 AM   Result Value Ref Range    Sodium 136 136 - 145 mmol/L    Potassium 2.9 (LL) 3.5 - 5.1 mmol/L    Chloride 99 98 - 110 mmol/L    CO2 23 21 - 32 mmol/L    Anion gap 14 5 - 15 mmol/L    Glucose 82 70 - 100 mg/dL    BUN 2 (L) 7 - 18 MG/DL    Creatinine 2.13 (L) 0.55 - 1.02 MG/DL    BUN/Creatinine ratio 4 (L) 12 - 20    GFR est AA >60 >60 ml/min/1.55m2    GFR est non-AA >60 >60 ml/min/1.73m2    Calcium 8.5 8.5 - 10.1 MG/DL     ??  .Pertinent imaging studies (if any):    Ct Abd Pelv Wo Cont    Result Date: 07/19/2017  ABDOMINAL AND PELVIC CT WITHOUT CONTRAST: CLINICAL HISTORY:  Abdominal pain, IV dye allergy. 5 mm axial images were obtained from the lung bases to the pubic symphysis without contrast.  3 mm sagittal and coronal reconstructions were performed. FINDINGS:  The visualized portions of the lung bases are clear.  The liver is diffusely low in attenuation compatible with fatty infiltration.  The spleen, pancreas, and adrenal glands are grossly unremarkable on this nonenhanced CT.  The kidneys show no calculus or hydronephrosis.  There is no significant retroperitoneal or pelvic adenopathy.  The urinary bladder is unremarkable.  Scattered pelvic phleboliths are seen.  Evaluation of the uterus and adnexa is limited by CT.  No gross abnormality  is seen.  There is laxity in the anterior abdominal wall musculature.  There are some distended loops of small bowel noted proximally in the left upper quadrant with an air-fluid level and fecalization.  This may represent an ileus.  The colon is not distended.  No free fluid is seen.  There is no free intraperitoneal area.  The appendix is seen in the right lower quadrant and is unremarkable.  The osseous structures are unremarkable.     : 1. DIFFUSE FATTY INFILTRATION OF THE LIVER. 2. DISTENDED LOOPS OF SMALL BOWEL SEEN IN THE LEFT UPPER QUADRANT WITH FECALIZATION.  THE  FINDINGS ARE MOST COMPATIBLE WITH AN ILEUS.  AN ENTERITIS CANNOT BE EXCLUDED.  CLINICAL CORRELATION AND FOLLOW-UP ARE RECOMMENDED.  A PARTIAL OR EARLY SMALL BOWEL OBSTRUCTION CANNOT BE EXCLUDED. 3. NORMAL APPENDIX. 4. LIMITED EVALUATION OF THE COLON AS IT IS PREDOMINANTLY COLLAPSED. The findings were reported to Natividad Brood in the ER at 5:39 PM. Dict 07/16/2017 17:35 #[id number]          DISPOSITION: Patient will be discharged Left Against Medical Advice.     ??

## 2017-07-19 NOTE — Progress Notes (Signed)
This RN walked into pt's room to give her, her AM meds as well as potassium d/t her potassium being 2.9. Pt was in the process of removing her tele and IV. She stated that "its her choice and she is sick of being hooked up to wires, [facility] is not her hospital [facility]". She stated she already had a ride and was leaving. IV removed and pt signed AMA form Dr.Chander made aware. Pt ambulatory off of unit.

## 2017-07-19 NOTE — Discharge Summary (Signed)
Hospitalist Discharge Summary  ??  ??  Name:  Vanessa Rosales     DOB:  11-20-1980    MRM:  [id number]  ????  Admit Date: 07/16/2017   ??  Discharge Date:  07/19/2017   ??  Primary Care Provider: None  ??  ??  Consultants:   1.  Gastroenterology:  Dr. Pasty Spillers      ??  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      PAST MEDICAL HISTORY:  Past Medical History:   Diagnosis Date   ??? Hypertension    ??? Mitral regurgitation    h/o IVDA      PAST SURGICAL HISTORY:  Past Surgical History:   Procedure Laterality Date   ??? HX HYSTEROSCOPY     ??? HX OTHER SURGICAL      foot surgery       DISCHARGE MEDS:  Discharge Medication List as of 07/19/2017  8:15 AM          ALLERGIES:  Allergies   Allergen Reactions   ??? Bee Venom Protein (Honey Bee) Anaphylaxis   ??? Ibuprofen Anaphylaxis   ??? Iodinated Contrast- Oral And Iv Dye Anaphylaxis   ??? Sage Not Reported This Time   ??? Shellfish Derived Anaphylaxis   ??? Sulfa (Sulfonamide Antibiotics) Anaphylaxis   ??? Tylenol [Acetaminophen] Anaphylaxis   ??? Doxycycline Rash   ??? Zofran [Ondansetron Hcl] Other (comments)     vomiting       Code Status  Full Code        ADMISSION HISTORY & HOSPTIAL COURSE:    37 year old female with history of IV drug use (heroin) she claims last use was approximately 1 year ago, found to have slightly elevated alcohol level, who presented with epigastric pain nausea and vomiting.  ??  1.  Epigastric pain/nausea/vomiting:   suspect cyclic nausea/vomiting from cannabis induced hyperemesis.  Patient was seen and evaluated by Gastroenterology and EGD was scheduled for a July 18, 2017.  But due to significant hypokalemia EGD was delayed until 07/19/2017.  Despite having aggressively replaced her potassium her potassium levels remained low.     It was felt that her hypokalemia was likely related to her nausea vomiting.  Also hypomagnesium could not be  excluded as patient decided to sign out against medical advice prior to further investigation.  I received a phone call from nursing that patient were to sign out against medical advice at 8 a.m..  I arrived on the floor at 8:15 am and patient had already signed out against medical advice prior to talk to me.  Per nursing patient could not wait to talk to a physician prior to signing out against medical advice.    2.  Transaminitis:  Likely due to alcohol use  [place] results pending  ??  3.  Abnormal urinalysis:  Patient does not have any symptoms of urinary tract infection.  Will hold on treatment as there is no indication for antibiotics  ??  4.  Hypokalemia  From vomiting  Replace K  K>3 for EGD      ??    ??  Vitals:??   ??  BP (!) 158/98 (BP 1 Location: Left arm, BP Patient Position: At rest)    Pulse 78    Temp 97.4 ??F (36.3 ??C)    Resp 18    Ht 5\' 2"  (1.575  m)    Wt 55.6 kg (122 lb 9.6 oz)    SpO2 98%    BMI 22.42 kg/m??   O2 Device: Room air     ??  Physical Exam:??   ??  Physical Exam  Not examined prior to signing AMA; as patient did not wait to speak to provider  ??  Lab Review:??   ??  Recent Results (from the past 24 hour(s))   POTASSIUM    Collection Time: 07/18/17  2:43 PM   Result Value Ref Range    Potassium 2.9 (LL) 3.5 - 5.1 mmol/L   CBC WITH AUTOMATED DIFF    Collection Time: 07/19/17  5:07 AM   Result Value Ref Range    WBC 4.4 4.0 - 10.0 K/uL    RBC 4.26 3.93 - 5.22 M/uL    HGB 14.1 11.8 - 14.8 g/dL    HCT 16.1 09.6 - 04.5 %    MCV 95.8 (H) 80.0 - 95.0 FL    MCH 33.1 (H) 25.6 - 32.2 PG    MCHC 34.6 32.2 - 35.5 g/dL    RDW 40.9 81.1 - 91.4 %    PLATELET 99 (L) 150 - 400 K/uL    MPV 11.3 9.4 - 12.4 FL    NEUTROPHILS 49 %    LYMPHOCYTES 33 %    MONOCYTES 13 %    EOSINOPHILS 4 %    BASOPHILS 1 0 - 1.2 %    ABS. NEUTROPHILS 2.2 1.6 - 6.1 K/UL    ABS. LYMPHOCYTES 1.4 1.2 - 3.7 K/UL    ABS. MONOCYTES 0.6 0.2 - 0.8 K/UL    ABS. EOSINOPHILS 0.2 0.0 - 0.5 K/UL    ABS. BASOPHILS 0.0 0.0 - 0.1 K/UL     IMMATURE GRANULOCYTES 0 0 - 0.4 %    ABS. IMM. GRANS. 0.0 0 - 0.03 K/UL   METABOLIC PANEL, BASIC    Collection Time: 07/19/17  5:07 AM   Result Value Ref Range    Sodium 136 136 - 145 mmol/L    Potassium 2.9 (LL) 3.5 - 5.1 mmol/L    Chloride 99 98 - 110 mmol/L    CO2 23 21 - 32 mmol/L    Anion gap 14 5 - 15 mmol/L    Glucose 82 70 - 100 mg/dL    BUN 2 (L) 7 - 18 MG/DL    Creatinine 7.82 (L) 0.55 - 1.02 MG/DL    BUN/Creatinine ratio 4 (L) 12 - 20    GFR est AA >60 >60 ml/min/1.93m2    GFR est non-AA >60 >60 ml/min/1.82m2    Calcium 8.5 8.5 - 10.1 MG/DL     ??  .Pertinent imaging studies (if any):    Ct Abd Pelv Wo Cont    Result Date: 07/19/2017  ABDOMINAL AND PELVIC CT WITHOUT CONTRAST: CLINICAL HISTORY:  Abdominal pain, IV dye allergy. 5 mm axial images were obtained from the lung bases to the pubic symphysis without contrast.  3 mm sagittal and coronal reconstructions were performed. FINDINGS:  The visualized portions of the lung bases are clear.  The liver is diffusely low in attenuation compatible with fatty infiltration.  The spleen, pancreas, and adrenal glands are grossly unremarkable on this nonenhanced CT.  The kidneys show no calculus or hydronephrosis.  There is no significant retroperitoneal or pelvic adenopathy.  The urinary bladder is unremarkable.  Scattered pelvic phleboliths are seen.  Evaluation of the uterus and adnexa is limited by CT.  No gross abnormality  is seen.  There is laxity in the anterior abdominal wall musculature.  There are some distended loops of small bowel noted proximally in the left upper quadrant with an air-fluid level and fecalization.  This may represent an ileus.  The colon is not distended.  No free fluid is seen.  There is no free intraperitoneal area.  The appendix is seen in the right lower quadrant and is unremarkable.  The osseous structures are unremarkable.     : 1. DIFFUSE FATTY INFILTRATION OF THE LIVER. 2. DISTENDED LOOPS OF SMALL  BOWEL SEEN IN THE LEFT UPPER QUADRANT WITH FECALIZATION.  THE FINDINGS ARE MOST COMPATIBLE WITH AN ILEUS.  AN ENTERITIS CANNOT BE EXCLUDED.  CLINICAL CORRELATION AND FOLLOW-UP ARE RECOMMENDED.  A PARTIAL OR EARLY SMALL BOWEL OBSTRUCTION CANNOT BE EXCLUDED. 3. NORMAL APPENDIX. 4. LIMITED EVALUATION OF THE COLON AS IT IS PREDOMINANTLY COLLAPSED. The findings were reported to Natividad BroodPeter Dias in the ER at 5:39 PM. Dict 07/16/2017 17:35 [id number]          DISPOSITION: Patient will be discharged Left Against Medical Advice.   ??

## 2017-07-20 LAB — HCV RNA (INTERNATIONAL UNITS), RFLX: HCV RNA Detect/Quant: NOT DETECTED IU/mL

## 2017-07-27 ENCOUNTER — Emergency Department: Admit: 2017-07-27 | Primary: Family Medicine

## 2017-07-27 ENCOUNTER — Inpatient Hospital Stay
Admit: 2017-07-27 | Discharge: 2017-07-27 | Disposition: A | Discharge status: Home / Self Care | Attending: Emergency Medical Services

## 2017-07-27 DIAGNOSIS — K529 Noninfective gastroenteritis and colitis, unspecified: Secondary | ICD-10-CM

## 2017-07-27 LAB — CBC WITH AUTOMATED DIFF
ABS. BASOPHILS: 0.1 10*3/uL (ref 0.0–0.1)
ABS. EOSINOPHILS: 0 10*3/uL (ref 0.0–0.5)
ABS. IMM. GRANS.: 0 10*3/uL (ref 0–0.03)
ABS. LYMPHOCYTES: 1.3 10*3/uL (ref 1.2–3.7)
ABS. MONOCYTES: 1.1 10*3/uL — ABNORMAL HIGH (ref 0.2–0.8)
ABS. NEUTROPHILS: 4.8 10*3/uL (ref 1.6–6.1)
BASOPHILS: 1 % (ref 0–1.2)
EOSINOPHILS: 0 %
HCT: 39.9 % (ref 36.0–45.0)
HGB: 13.6 g/dL (ref 11.8–14.8)
IMMATURE GRANULOCYTES: 0 % (ref 0–0.4)
LYMPHOCYTES: 18 %
MCH: 33 PG — ABNORMAL HIGH (ref 25.6–32.2)
MCHC: 34.1 g/dL (ref 32.2–35.5)
MCV: 96.8 FL — ABNORMAL HIGH (ref 80.0–95.0)
MONOCYTES: 16 %
MPV: 10.5 FL (ref 9.4–12.4)
NEUTROPHILS: 65 %
PLATELET: 193 10*3/uL (ref 150–400)
RBC: 4.12 M/uL (ref 3.93–5.22)
RDW: 14.5 % — ABNORMAL HIGH (ref 11.6–14.4)
WBC: 7.3 10*3/uL (ref 4.0–10.0)

## 2017-07-27 LAB — LIPASE: Lipase: 162 U/L (ref 73–393)

## 2017-07-27 LAB — METABOLIC PANEL, COMPREHENSIVE
A-G Ratio: 0.9 — ABNORMAL LOW (ref 1.0–3.0)
ALT (SGPT): 153 U/L — ABNORMAL HIGH (ref 13–56)
AST (SGOT): 174 U/L — ABNORMAL HIGH (ref 15–37)
Albumin: 3.5 g/dL (ref 3.4–5.0)
Alk. phosphatase: 207 U/L — ABNORMAL HIGH (ref 45–117)
Anion gap: 13 mmol/L (ref 5–15)
BUN/Creatinine ratio: 7 — ABNORMAL LOW (ref 12–20)
BUN: 5 MG/DL — ABNORMAL LOW (ref 7–18)
Bilirubin, total: 0.78 mg/dL (ref 0.20–1.20)
CO2: 30 mmol/L (ref 21–32)
Calcium: 9.3 MG/DL (ref 8.5–10.1)
Chloride: 103 mmol/L (ref 98–110)
Creatinine: 0.67 MG/DL (ref 0.55–1.02)
GFR est AA: >60 mL/min/{1.73_m2} (ref >60)
GFR est non-AA: >60 mL/min/{1.73_m2} (ref >60)
Globulin: 4 g/dL — ABNORMAL HIGH (ref 2.5–3.5)
Glucose: 108 mg/dL — ABNORMAL HIGH (ref 70–100)
Potassium: 3.1 mmol/L — ABNORMAL LOW (ref 3.5–5.1)
Protein, total: 7.5 g/dL (ref 6.4–8.2)
Sodium: 146 mmol/L — ABNORMAL HIGH (ref 136–145)

## 2017-07-27 LAB — DRUG ABUSE SCREEN URINE
6-Acetylmorphone, urine: NEGATIVE
AMPHETAMINES: NEGATIVE
BARBITURATES: NEGATIVE
BENZODIAZEPINES: NEGATIVE
Buprenorphine screen, urine: NEGATIVE
COCAINE: NEGATIVE
Creatinine, urine (drug screen): 20.5 mg/dL
EDDP, urine: NEGATIVE
FENTANYL: NEGATIVE
OPIATES: NEGATIVE
OXYCODONE: NEGATIVE
pH, urine (drug screen): 6.5

## 2017-07-27 LAB — URINE MICROSCOPIC WITH REFLEX CULTURE
Bacteria: NOT DETECTED /hpf
Epithelial cells: 3 /hpf (ref <5)
Hyaline cast: NOT DETECTED /lpf (ref <8)
RBC: 1 /hpf (ref <5)
WBC: 9 /hpf — ABNORMAL HIGH (ref <5)

## 2017-07-27 LAB — AMMONIA: Ammonia, plasma: 22 umol/L (ref 11–32)

## 2017-07-27 LAB — ETHYL ALCOHOL: ALCOHOL(ETHYL),SERUM: 166.8 MG/DL — ABNORMAL HIGH (ref <3.0)

## 2017-07-27 MED ORDER — PROMETHAZINE 25 MG/ML INJECTION
25 mg/mL | INTRAMUSCULAR | Status: AC
Start: 2017-07-27 — End: 2017-07-27
  Administered 2017-07-27: 08:00:00 via INTRAVENOUS

## 2017-07-27 MED ORDER — METOCLOPRAMIDE 5 MG/ML IJ SOLN
5 mg/mL | INTRAMUSCULAR | Status: AC
Start: 2017-07-27 — End: 2017-07-27
  Administered 2017-07-27: 09:00:00 via INTRAVENOUS

## 2017-07-27 MED ORDER — METRONIDAZOLE 500 MG TAB
500 mg | ORAL_TABLET | Freq: Three times a day (TID) | ORAL | 0 refills | Status: AC
Start: 2017-07-27 — End: 2017-08-06

## 2017-07-27 MED ORDER — CIPROFLOXACIN 250 MG TAB
250 mg | ORAL_TABLET | Freq: Two times a day (BID) | ORAL | 0 refills | Status: AC
Start: 2017-07-27 — End: ?

## 2017-07-27 MED ORDER — SODIUM CHLORIDE 0.9% BOLUS IV
0.9 % | Freq: Once | INTRAVENOUS | Status: AC
Start: 2017-07-27 — End: 2017-07-27
  Administered 2017-07-27: 08:00:00 via INTRAVENOUS

## 2017-07-27 MED FILL — SODIUM CHLORIDE 0.9 % IV: INTRAVENOUS | Qty: 1000

## 2017-07-27 MED FILL — METOCLOPRAMIDE 5 MG/ML IJ SOLN: 5 mg/mL | INTRAMUSCULAR | Qty: 2

## 2017-07-27 MED FILL — PROMETHAZINE 25 MG/ML INJECTION: 25 mg/mL | INTRAMUSCULAR | Qty: 1

## 2017-07-27 NOTE — ED Notes (Signed)
The patient states that she feels no better, the patient is awaiting results and disposition

## 2017-07-27 NOTE — ED Notes (Signed)
The patient has returned from cat scan and states that she is states that she is nauseous and has a lot of pain, provider aware and order placed

## 2017-07-27 NOTE — ED Notes (Signed)
Pt given ginger ale to help settle stomach.

## 2017-07-27 NOTE — ED Provider Notes (Addendum)
This is 37 year old female, coming to ED after having intractable nausea vomiting abdominal cramps that has been going on for past few days, just recently left against medical advice from our hospital after initial admission with gastroenteritis with hyper emesis from marijuana smoking, questionable ileus versus partial bowel obstruction.  Patient was has been drinking alcohol as well. Patient just came from [place] to join her fiancee for past 2 months, no local PCP.  Past medical history of hypertension, mitral regurgitation but does not required any prophylactic antibiotics, chronic alcohol drinker as well. Patient is a smoker, but no other illicit substance use except smoking marijuana daily basis.           Past Medical History:   Diagnosis Date   ??? Hypertension    ??? Mitral regurgitation        Past Surgical History:   Procedure Laterality Date   ??? HX HYSTEROSCOPY     ??? HX OTHER SURGICAL      foot surgery         Family History:   Problem Relation Age of Onset   ??? Hypertension Mother    ??? Arthritis Mother    ??? Alcohol abuse Father    ??? Kidney Disease Father         ESRD       Social History     Socioeconomic History   ??? Marital status: SINGLE     Spouse name: Not on file   ??? Number of children: Not on file   ??? Years of education: Not on file   ??? Highest education level: Not on file   Occupational History   ??? Not on file   Social Needs   ??? Financial resource strain: Not on file   ??? Food insecurity:     Worry: Not on file     Inability: Not on file   ??? Transportation needs:     Medical: Not on file     Non-medical: Not on file   Tobacco Use   ??? Smoking status: Current Every Day Smoker     Packs/day: 0.50     Years: 4.00     Pack years: 2.00   ??? Smokeless tobacco: Never Used   Substance and Sexual Activity   ??? Alcohol use: Yes     Frequency: Monthly or less     Comment: last drink supposedly 1 mo ago    ??? Drug use: No   ??? Sexual activity: Yes     Birth control/protection: None   Lifestyle   ??? Physical activity:      Days per week: Not on file     Minutes per session: Not on file   ??? Stress: Not on file   Relationships   ??? Social connections:     Talks on phone: Not on file     Gets together: Not on file     Attends religious service: Not on file     Active member of club or organization: Not on file     Attends meetings of clubs or organizations: Not on file     Relationship status: Not on file   ??? Intimate partner violence:     Fear of current or ex partner: Not on file     Emotionally abused: Not on file     Physically abused: Not on file     Forced sexual activity: Not on file   Other Topics Concern   ??? Not on file  Social History Narrative   ??? Not on file         ALLERGIES: Bee venom protein (honey bee); Ibuprofen; Iodinated contrast- oral and iv dye; Sage; Shellfish derived; Sulfa (sulfonamide antibiotics); Tylenol [acetaminophen]; Doxycycline; and Zofran [ondansetron hcl]    Review of Systems   Constitutional: Negative for chills and fever.   Eyes: Negative for visual disturbance.   Respiratory: Negative for chest tightness.    Cardiovascular: Negative for chest pain, palpitations and leg swelling.   Gastrointestinal: Positive for abdominal pain, diarrhea, nausea and vomiting.   Genitourinary: Negative for dyspareunia and hematuria.   Musculoskeletal: Negative for back pain.   Neurological: Negative for light-headedness.   Psychiatric/Behavioral: Negative for dysphoric mood and suicidal ideas. The patient is nervous/anxious.        Vitals:    07/27/17 0325 07/27/17 0500   BP: (!) 175/115 156/86   Pulse: (!) 108 86   Resp: 18 18   Temp: 98.2 ??F (36.8 ??C)    SpO2: 98% 98%   Weight: 54.7 kg (120 lb 8 oz)    Height: 5\' 2"  (1.575 m)             Physical Exam   Constitutional: She is oriented to person, place, and time. She appears well-developed and well-nourished.  Non-toxic appearance. She does not appear ill. No distress.   Constantly gagging but not actually vomiting, anxious, but ambulating back  and forth to the bathroom without any difficulty, afebrile, slight tachycardia, much improved, initial hypertension, also improved upon discharge home   HENT:   Head: Normocephalic and atraumatic.   Mouth/Throat: Oropharynx is clear and moist.   Eyes: Pupils are equal, round, and reactive to light. EOM are normal. No scleral icterus.   Cardiovascular: Normal rate, regular rhythm and normal heart sounds.   Abdominal: Normal appearance and bowel sounds are normal. There is generalized tenderness. There is no rigidity, no guarding and no CVA tenderness.   Diffuse tenderness with deep palpation but no guarding,   Neurological: She is alert and oriented to person, place, and time.   Skin: Skin is warm and dry.   Psychiatric: She has a normal mood and affect. Her behavior is normal.   Nursing note and vitals reviewed.       MDM  Number of Diagnoses or Management Options  Acute colitis:   Diagnosis management comments: 37 year old female, coming to ED with intractable nausea vomiting diarrhea that has been going for past few days, just left AMA after similar presentation, IV hydration, Zofran for nausea, routine lab was unremarkable but due to "questionable partial bowel obstruction" by CT scan last week, I have repeated CT scan of abdomen and pelvis, shows acute colitis but no bowel obstruction or ileus.  Patient was able to drink fluid without further vomiting, will start antibiotics empirically, also advised to bring stool specimen to the outpatient laboratory for study       Amount and/or Complexity of Data Reviewed  Clinical lab tests: ordered and reviewed  Tests in the radiology section of CPT??: ordered and reviewed  Review and summarize past medical records: yes    Risk of Complications, Morbidity, and/or Mortality  Presenting problems: high  Diagnostic procedures: high  Management options: moderate    Patient Progress  Patient progress: stable    ED Course as of Jul 27 721   Wed Jul 27, 2017    0711 CT scan of abdomen and pelvis without IV contrast shows interval in the development of hypodense  mucosal thickening in the transverse, descending and sigmoid colon abdomen, suggest of acute colitis, no evidence of ileus or bowel obstruction, [facility]    [YL]      ED Course User Index  [YL] Cherlynn PoloLee, Drayson Dorko Hwa, MD       Procedures

## 2017-07-27 NOTE — ED Notes (Signed)
[facility]

## 2017-07-27 NOTE — ED Triage Notes (Signed)
The patient was diagnosed with a small bowel obstruction and was admitted, and left ama, now not feeling better and feeling better

## 2017-07-27 NOTE — ED Notes (Signed)
Pt ready for discharge and seeking transportation home from ED.

## 2019-12-24 ENCOUNTER — Emergency Department (HOSPITAL_COMMUNITY)
Admission: EM | Admit: 2019-12-24 | Discharge: 2019-12-24 | Disposition: A | Discharge status: Home / Self Care | Payer: Medicare Other | Attending: Emergency Medicine | Admitting: Emergency Medicine

## 2019-12-24 ENCOUNTER — Encounter (HOSPITAL_COMMUNITY): Payer: Self-pay | Admitting: Emergency Medicine

## 2019-12-24 DIAGNOSIS — E876 Hypokalemia: Secondary | ICD-10-CM | POA: Diagnosis not present

## 2019-12-24 DIAGNOSIS — T82111A Breakdown (mechanical) of cardiac pulse generator (battery), initial encounter: Secondary | ICD-10-CM | POA: Diagnosis not present

## 2019-12-24 DIAGNOSIS — I4891 Unspecified atrial fibrillation: Secondary | ICD-10-CM

## 2019-12-24 DIAGNOSIS — R197 Diarrhea, unspecified: Secondary | ICD-10-CM | POA: Diagnosis not present

## 2019-12-24 DIAGNOSIS — R17 Unspecified jaundice: Secondary | ICD-10-CM

## 2019-12-24 DIAGNOSIS — Z4502 Encounter for adjustment and management of automatic implantable cardiac defibrillator: Secondary | ICD-10-CM

## 2019-12-24 DIAGNOSIS — R112 Nausea with vomiting, unspecified: Secondary | ICD-10-CM | POA: Diagnosis present

## 2019-12-24 LAB — COMPREHENSIVE METABOLIC PANEL
ALT: 18 U/L (ref 0–44)
AST: 28 U/L (ref 15–41)
Albumin: 3.7 g/dL (ref 3.5–5.0)
Alkaline Phosphatase: 81 U/L (ref 38–126)
Anion gap: 11 (ref 5–15)
BUN: 5 mg/dL — ABNORMAL LOW (ref 6–20)
CO2: 19 mmol/L — ABNORMAL LOW (ref 22–32)
Calcium: 9.2 mg/dL (ref 8.9–10.3)
Chloride: 107 mmol/L (ref 98–111)
Creatinine, Ser: 0.63 mg/dL (ref 0.44–1.00)
GFR calc Af Amer: >60 mL/min (ref >60)
GFR calc non Af Amer: >60 mL/min (ref >60)
Glucose, Bld: 102 mg/dL — ABNORMAL HIGH (ref 70–99)
Potassium: 3.4 mmol/L — ABNORMAL LOW (ref 3.5–5.1)
Sodium: 137 mmol/L (ref 135–145)
Total Bilirubin: 2.1 mg/dL — ABNORMAL HIGH (ref 0.3–1.2)
Total Protein: 7 g/dL (ref 6.5–8.1)

## 2019-12-24 LAB — CBC WITH DIFFERENTIAL/PLATELET
Abs Immature Granulocytes: 0.01 10*3/uL (ref 0.00–0.07)
Basophils Absolute: 0.1 10*3/uL (ref 0.0–0.1)
Basophils Relative: 1 %
Eosinophils Absolute: 0.2 10*3/uL (ref 0.0–0.5)
Eosinophils Relative: 4 %
HCT: 44.2 % (ref 36.0–46.0)
Hemoglobin: 14.1 g/dL (ref 12.0–15.0)
Immature Granulocytes: 0 %
Lymphocytes Relative: 23 %
Lymphs Abs: 1.1 10*3/uL (ref 0.7–4.0)
MCH: 27.4 pg (ref 26.0–34.0)
MCHC: 31.9 g/dL (ref 30.0–36.0)
MCV: 85.8 fL (ref 80.0–100.0)
Monocytes Absolute: 0.4 10*3/uL (ref 0.1–1.0)
Monocytes Relative: 9 %
Neutro Abs: 3.1 10*3/uL (ref 1.7–7.7)
Neutrophils Relative %: 63 %
Platelets: 160 10*3/uL (ref 150–400)
RBC: 5.15 MIL/uL — ABNORMAL HIGH (ref 3.87–5.11)
RDW: 17.2 % — ABNORMAL HIGH (ref 11.5–15.5)
WBC: 4.8 10*3/uL (ref 4.0–10.5)
nRBC: 0 % (ref 0.0–0.2)

## 2019-12-24 LAB — MAGNESIUM: Magnesium: 1.8 mg/dL (ref 1.7–2.4)

## 2019-12-24 MED ORDER — ONDANSETRON 8 MG PO TBDP: 8.0000 mg | ORAL_TABLET | Freq: Three times a day (TID) | ORAL | 0 refills | Status: DC | PRN

## 2019-12-24 MED ORDER — POTASSIUM CHLORIDE CRYS ER 20 MEQ PO TBCR: 20.0000 meq | EXTENDED_RELEASE_TABLET | Freq: Two times a day (BID) | ORAL | 0 refills | Status: DC

## 2019-12-24 MED ORDER — PROCHLORPERAZINE MALEATE 10 MG PO TABS: 10.0000 mg | ORAL_TABLET | Freq: Four times a day (QID) | ORAL | 0 refills | Status: DC | PRN

## 2019-12-24 MED ORDER — LOPERAMIDE HCL 2 MG PO CAPS
4.0000 mg | ORAL_CAPSULE | Freq: Once | ORAL | Status: AC
  Administered 2019-12-24: 4 mg via ORAL
  Filled 2019-12-24: qty 2

## 2019-12-24 MED ORDER — LACTATED RINGERS IV BOLUS
1000.0000 mL | Freq: Once | INTRAVENOUS | Status: AC
  Administered 2019-12-24: 1000 mL via INTRAVENOUS

## 2019-12-24 MED ORDER — ONDANSETRON HCL 4 MG/2ML IJ SOLN
4.0000 mg | Freq: Once | INTRAMUSCULAR | Status: AC
  Administered 2019-12-24: 4 mg via INTRAVENOUS
  Filled 2019-12-24: qty 2

## 2019-12-24 MED ORDER — METOPROLOL TARTRATE 25 MG PO TABS: 50.0000 mg | ORAL_TABLET | Freq: Once | ORAL | 0 refills | Status: DC

## 2019-12-24 MED ORDER — PROCHLORPERAZINE EDISYLATE 10 MG/2ML IJ SOLN
10.0000 mg | Freq: Once | INTRAMUSCULAR | Status: AC
  Administered 2019-12-24: 10 mg via INTRAVENOUS
  Filled 2019-12-24: qty 2

## 2019-12-24 MED ORDER — POTASSIUM CHLORIDE CRYS ER 20 MEQ PO TBCR: 40.0000 meq | EXTENDED_RELEASE_TABLET | Freq: Once | ORAL | Status: DC

## 2019-12-24 NOTE — ED Provider Notes (Signed)
[facility] EMERGENCY DEPARTMENT Provider Note   CSN: [id number] Arrival date & time: 12/24/19  0515   History Chief Complaint  Patient presents with  . defib fired    Rachel Fuller is a 39 y.o. female.  The history is provided by the patient.  She has history of hypertension, heart failure, implanted pacemaker/defibrillator and comes in after her defibrillator fired.  She states that for the last week she has had nausea, vomiting, diarrhea and has not been able to hold anything down.  She has been feeling extremely weak for the last 24-48 hours.  She denies fever or chills.  She has not done anything to treat her symptoms.  At the time her defibrillator fired, she does admit to feeling lightheaded and feeling like her heart was racing.  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  No past medical history on file.  There are no problems to display for this patient.   ** The histories are not reviewed yet. Please review them in the "History" navigator section and refresh this Men[place].   OB History   No obstetric history on file.     No family history on file.  Social History   Tobacco Use  . Smoking status: Not on file  Substance Use Topics  . Alcohol use: Not on file  . Drug use: Not on file    Home Medications Prior to Admission medications   Not on File    Allergies    Gabapentin, Ivp dye [iodinated diagnostic agents], Doxycycline, Sulfa antibiotics, Tylenol [acetaminophen], and Motrin [ibuprofen]  Review of Systems   Review of Systems  All other systems reviewed and are negative.   Physical Exam Updated Vital Signs Temp (!) 97 F (36.1 C) (Temporal)   Physical Exam Vitals and nursing note reviewed.   39 year old female, resting comfortably and in no acute distress. Vital signs are normal. Oxygen saturation is 100%, which is normal. Head is normocephalic and atraumatic. PERRLA, EOMI. Oropharynx is clear. Neck  is nontender and supple without adenopathy or JVD. Back is nontender and there is no CVA tenderness. Lungs are clear without rales, wheezes, or rhonchi. Chest is nontender.  Pacemaker present left subclavian area with incision healed well without signs of infection. Heart has regular rate and rhythm without murmur. Abdomen is soft, flat, nontender without masses or hepatosplenomegaly and peristalsis is hypoactive. Extremities have no cyanosis or edema, full range of motion is present. Skin is warm and dry without rash. Neurologic: Mental status is normal, cranial nerves are intact, there are no motor or sensory deficits.  ED Results / Procedures / Treatments   Labs (all labs ordered are listed, but only abnormal results are displayed) Labs Reviewed  COMPREHENSIVE METABOLIC PANEL  CBC WITH DIFFERENTIAL/PLATELET  MAGNESIUM    EKG None  Radiology No results found.  Procedures Procedures   Medications Ordered in ED Medications  ondansetron (ZOFRAN) injection 4 mg (has no administration in time range)  lactated ringers bolus 1,000 mL (has no administration in time range)  loperamide (IMODIUM) capsule 4 mg (has no administration in time range)    ED Course  I have reviewed the triage vital signs and the nursing notes.  Pertinent labs & imaging results that were available during my care of the patient were reviewed by me and considered in my medical decision making (see chart for details).  MDM Rules/Calculators/A&P Nausea, vomiting, diarrhea.  Defibrillator fired.  It seems from  her history that it was an appropriate defibrillator discharge.  Will check electrolytes and magnesium and give IV fluids.  Old records are reviewed confirming placement of defibrillator on August 3.  Potassium is come back borderline low at 3.4 and she is given a dose of oral potassium.  She continues to complain of nausea following ondansetron and is given a dose of prochlorperazine.  She is complaining  of pain and requesting pain medication but with her history of drug-seeking behavior, pain medications are not given.  Defibrillator was interrogated and it appears that she is having runs of atrial fibrillation.  Will request cardiology consultation to assess whether she needs to have her defibrillator parameters adjusted.  I have discussed the case with Cecilie Kicks, nurse practitioner for cardiology who states she will send one of the electrophysiologists down to evaluate the patient.  Case is signed out to Dr. Zenia Resides.  Final Clinical Impression(s) / ED Diagnoses Final diagnoses:  Defibrillator discharge  Nausea vomiting and diarrhea  Hypokalemia  Serum total bilirubin elevated    Rx / DC Orders ED Discharge Orders         Ordered    potassium chloride SA (KLOR-CON) 20 MEQ tablet  2 times daily        12/24/19 0722    prochlorperazine (COMPAZINE) 10 MG tablet  Every 6 hours PRN        12/24/19 2341           Delora Fuel, MD 44/36/01 2237

## 2019-12-24 NOTE — ED Provider Notes (Signed)
Patient signed to me by Dr. Roxanne Mins pending review by cardiology of patient's device.  Cardiology recommends outpatient take her propranolol and given a prescription for Zofran and follow-up with her cardiologist at [facility].  Patient is agreeable to this   Lacretia Leigh, MD 12/24/19 1044

## 2019-12-24 NOTE — Discharge Instructions (Signed)
Follow-up with your cardiologist for medication management.  Please take your medications as directed

## 2019-12-24 NOTE — ED Notes (Signed)
Report from [place] given to MD Roxanne Mins

## 2019-12-24 NOTE — ED Triage Notes (Signed)
39 year old female pt arrives via [facility] from home with chief complaint of defibrillator fired while vomiting.   Pt has history of liver disease, enlarged gallbladder & cancer. Pt had [facility] month ago and reports at that time had a cardiac arrest and had a [employer] pacemaker placed (August 3rd).  Pt states she has been having vomiting for the last 1 week and this morning was awake vomiting when she felt a strong jolt in her chest and seen white. Pt now has mild pain in the center of her chest.

## 2019-12-24 NOTE — ED Notes (Signed)
Urine specimen at bedside table if needed.
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
## 2020-01-29 ENCOUNTER — Emergency Department (HOSPITAL_COMMUNITY): Payer: Medicare Other

## 2020-01-29 ENCOUNTER — Other Ambulatory Visit: Payer: Self-pay

## 2020-01-29 ENCOUNTER — Inpatient Hospital Stay (HOSPITAL_COMMUNITY)
Admission: EM | Admit: 2020-01-29 | Discharge: 2020-01-30 | DRG: 432 | Discharge status: Against Medical Advice | Payer: Medicare Other | Attending: Internal Medicine | Admitting: Internal Medicine

## 2020-01-29 DIAGNOSIS — R680 Hypothermia, not associated with low environmental temperature: Secondary | ICD-10-CM | POA: Diagnosis present

## 2020-01-29 DIAGNOSIS — R571 Hypovolemic shock: Secondary | ICD-10-CM | POA: Diagnosis present

## 2020-01-29 DIAGNOSIS — F17210 Nicotine dependence, cigarettes, uncomplicated: Secondary | ICD-10-CM | POA: Diagnosis present

## 2020-01-29 DIAGNOSIS — I426 Alcoholic cardiomyopathy: Secondary | ICD-10-CM | POA: Diagnosis present

## 2020-01-29 DIAGNOSIS — K7031 Alcoholic cirrhosis of liver with ascites: Secondary | ICD-10-CM | POA: Diagnosis present

## 2020-01-29 DIAGNOSIS — Z91041 Radiographic dye allergy status: Secondary | ICD-10-CM | POA: Diagnosis not present

## 2020-01-29 DIAGNOSIS — Z882 Allergy status to sulfonamides status: Secondary | ICD-10-CM

## 2020-01-29 DIAGNOSIS — Z20822 Contact with and (suspected) exposure to covid-19: Secondary | ICD-10-CM | POA: Diagnosis present

## 2020-01-29 DIAGNOSIS — I110 Hypertensive heart disease with heart failure: Secondary | ICD-10-CM | POA: Diagnosis present

## 2020-01-29 DIAGNOSIS — Z800 Family history of malignant neoplasm of digestive organs: Secondary | ICD-10-CM

## 2020-01-29 DIAGNOSIS — Z888 Allergy status to other drugs, medicaments and biological substances status: Secondary | ICD-10-CM

## 2020-01-29 DIAGNOSIS — I5042 Chronic combined systolic (congestive) and diastolic (congestive) heart failure: Secondary | ICD-10-CM | POA: Diagnosis present

## 2020-01-29 DIAGNOSIS — Z5329 Procedure and treatment not carried out because of patient's decision for other reasons: Secondary | ICD-10-CM | POA: Diagnosis present

## 2020-01-29 DIAGNOSIS — E876 Hypokalemia: Secondary | ICD-10-CM | POA: Diagnosis present

## 2020-01-29 DIAGNOSIS — Z950 Presence of cardiac pacemaker: Secondary | ICD-10-CM | POA: Diagnosis not present

## 2020-01-29 DIAGNOSIS — Z91030 Bee allergy status: Secondary | ICD-10-CM

## 2020-01-29 DIAGNOSIS — E872 Acidosis: Secondary | ICD-10-CM | POA: Diagnosis present

## 2020-01-29 DIAGNOSIS — Z886 Allergy status to analgesic agent status: Secondary | ICD-10-CM

## 2020-01-29 DIAGNOSIS — E871 Hypo-osmolality and hyponatremia: Secondary | ICD-10-CM | POA: Diagnosis present

## 2020-01-29 DIAGNOSIS — I4891 Unspecified atrial fibrillation: Secondary | ICD-10-CM | POA: Diagnosis present

## 2020-01-29 DIAGNOSIS — R1084 Generalized abdominal pain: Secondary | ICD-10-CM

## 2020-01-29 DIAGNOSIS — E861 Hypovolemia: Secondary | ICD-10-CM | POA: Diagnosis present

## 2020-01-29 DIAGNOSIS — Z91013 Allergy to seafood: Secondary | ICD-10-CM

## 2020-01-29 DIAGNOSIS — E162 Hypoglycemia, unspecified: Secondary | ICD-10-CM | POA: Diagnosis present

## 2020-01-29 DIAGNOSIS — K7290 Hepatic failure, unspecified without coma: Secondary | ICD-10-CM | POA: Diagnosis present

## 2020-01-29 DIAGNOSIS — I8510 Secondary esophageal varices without bleeding: Secondary | ICD-10-CM | POA: Diagnosis present

## 2020-01-29 DIAGNOSIS — Z881 Allergy status to other antibiotic agents status: Secondary | ICD-10-CM

## 2020-01-29 DIAGNOSIS — K7460 Unspecified cirrhosis of liver: Secondary | ICD-10-CM | POA: Diagnosis present

## 2020-01-29 DIAGNOSIS — T68XXXA Hypothermia, initial encounter: Secondary | ICD-10-CM

## 2020-01-29 LAB — I-STAT VENOUS BLOOD GAS, ED
Acid-base deficit: 6 mmol/L — ABNORMAL HIGH (ref 0.0–2.0)
Bicarbonate: 17.9 mmol/L — ABNORMAL LOW (ref 20.0–28.0)
Calcium, Ion: 1.01 mmol/L — ABNORMAL LOW (ref 1.15–1.40)
HCT: 50 % — ABNORMAL HIGH (ref 36.0–46.0)
Hemoglobin: 17 g/dL — ABNORMAL HIGH (ref 12.0–15.0)
O2 Saturation: 100 %
Potassium: 4 mmol/L (ref 3.5–5.1)
Sodium: 129 mmol/L — ABNORMAL LOW (ref 135–145)
TCO2: 19 mmol/L — ABNORMAL LOW (ref 22–32)
pCO2, Ven: 29.9 mmHg — ABNORMAL LOW (ref 44.0–60.0)
pH, Ven: 7.386 (ref 7.250–7.430)
pO2, Ven: 195 mmHg — ABNORMAL HIGH (ref 32.0–45.0)

## 2020-01-29 LAB — RESPIRATORY PANEL BY RT PCR (FLU A&B, COVID)
Influenza A by PCR: NEGATIVE
Influenza B by PCR: NEGATIVE
SARS Coronavirus 2 by RT PCR: NEGATIVE

## 2020-01-29 LAB — URINALYSIS, ROUTINE W REFLEX MICROSCOPIC
Bacteria, UA: NONE SEEN
Bilirubin Urine: NEGATIVE
Glucose, UA: >=500 mg/dL — AB
Hgb urine dipstick: NEGATIVE
Ketones, ur: 80 mg/dL — AB
Leukocytes,Ua: NEGATIVE
Nitrite: NEGATIVE
Protein, ur: NEGATIVE mg/dL
Specific Gravity, Urine: 1.005 (ref 1.005–1.030)
pH: 7 (ref 5.0–8.0)

## 2020-01-29 LAB — MAGNESIUM
Magnesium: 1.8 mg/dL (ref 1.7–2.4)
Magnesium: 2.2 mg/dL (ref 1.7–2.4)

## 2020-01-29 LAB — CBC WITH DIFFERENTIAL/PLATELET
Abs Immature Granulocytes: 0.03 10*3/uL (ref 0.00–0.07)
Basophils Absolute: 0.1 10*3/uL (ref 0.0–0.1)
Basophils Relative: 1 %
Eosinophils Absolute: 0.1 10*3/uL (ref 0.0–0.5)
Eosinophils Relative: 1 %
HCT: 46.7 % — ABNORMAL HIGH (ref 36.0–46.0)
Hemoglobin: 14.4 g/dL (ref 12.0–15.0)
Immature Granulocytes: 1 %
Lymphocytes Relative: 26 %
Lymphs Abs: 1.6 10*3/uL (ref 0.7–4.0)
MCH: 26.9 pg (ref 26.0–34.0)
MCHC: 30.8 g/dL (ref 30.0–36.0)
MCV: 87.3 fL (ref 80.0–100.0)
Monocytes Absolute: 0.3 10*3/uL (ref 0.1–1.0)
Monocytes Relative: 6 %
Neutro Abs: 3.8 10*3/uL (ref 1.7–7.7)
Neutrophils Relative %: 65 %
Platelets: 131 10*3/uL — ABNORMAL LOW (ref 150–400)
RBC: 5.35 MIL/uL — ABNORMAL HIGH (ref 3.87–5.11)
RDW: 21.3 % — ABNORMAL HIGH (ref 11.5–15.5)
WBC: 5.9 10*3/uL (ref 4.0–10.5)
nRBC: 0 % (ref 0.0–0.2)

## 2020-01-29 LAB — CBG MONITORING, ED
Glucose-Capillary: 241 mg/dL — ABNORMAL HIGH (ref 70–99)
Glucose-Capillary: 159 mg/dL — ABNORMAL HIGH (ref 70–99)
Glucose-Capillary: 152 mg/dL — ABNORMAL HIGH (ref 70–99)
Glucose-Capillary: 112 mg/dL — ABNORMAL HIGH (ref 70–99)
Glucose-Capillary: 92 mg/dL (ref 70–99)
Glucose-Capillary: 85 mg/dL (ref 70–99)
Glucose-Capillary: 68 mg/dL — ABNORMAL LOW (ref 70–99)

## 2020-01-29 LAB — TYPE AND SCREEN
ABO/RH(D): O POS
Antibody Screen: NEGATIVE

## 2020-01-29 LAB — COMPREHENSIVE METABOLIC PANEL
ALT: 100 U/L — ABNORMAL HIGH (ref 0–44)
ALT: 76 U/L — ABNORMAL HIGH (ref 0–44)
AST: 243 U/L — ABNORMAL HIGH (ref 15–41)
AST: 156 U/L — ABNORMAL HIGH (ref 15–41)
Albumin: 3.8 g/dL (ref 3.5–5.0)
Albumin: 2.7 g/dL — ABNORMAL LOW (ref 3.5–5.0)
Alkaline Phosphatase: 127 U/L — ABNORMAL HIGH (ref 38–126)
Alkaline Phosphatase: 86 U/L (ref 38–126)
Anion gap: 19 — ABNORMAL HIGH (ref 5–15)
Anion gap: 15 (ref 5–15)
BUN: 8 mg/dL (ref 6–20)
BUN: 7 mg/dL (ref 6–20)
CO2: 15 mmol/L — ABNORMAL LOW (ref 22–32)
CO2: 14 mmol/L — ABNORMAL LOW (ref 22–32)
Calcium: 9 mg/dL (ref 8.9–10.3)
Calcium: 7.9 mg/dL — ABNORMAL LOW (ref 8.9–10.3)
Chloride: 95 mmol/L — ABNORMAL LOW (ref 98–111)
Chloride: 106 mmol/L (ref 98–111)
Creatinine, Ser: 0.73 mg/dL (ref 0.44–1.00)
Creatinine, Ser: 0.71 mg/dL (ref 0.44–1.00)
GFR, Estimated: >60 mL/min (ref >60)
GFR, Estimated: >60 mL/min (ref >60)
Glucose, Bld: 233 mg/dL — ABNORMAL HIGH (ref 70–99)
Glucose, Bld: 59 mg/dL — ABNORMAL LOW (ref 70–99)
Potassium: 2.8 mmol/L — ABNORMAL LOW (ref 3.5–5.1)
Potassium: 3.6 mmol/L (ref 3.5–5.1)
Sodium: 129 mmol/L — ABNORMAL LOW (ref 135–145)
Sodium: 135 mmol/L (ref 135–145)
Total Bilirubin: 5.2 mg/dL — ABNORMAL HIGH (ref 0.3–1.2)
Total Bilirubin: 3.7 mg/dL — ABNORMAL HIGH (ref 0.3–1.2)
Total Protein: 7.2 g/dL (ref 6.5–8.1)
Total Protein: 5.2 g/dL — ABNORMAL LOW (ref 6.5–8.1)

## 2020-01-29 LAB — APTT: aPTT: 33 seconds (ref 24–36)

## 2020-01-29 LAB — PROTIME-INR
INR: 1.2 (ref 0.8–1.2)
Prothrombin Time: 15.2 seconds (ref 11.4–15.2)

## 2020-01-29 LAB — TSH: TSH: 0.922 u[IU]/mL (ref 0.350–4.500)

## 2020-01-29 LAB — LIPASE, BLOOD: Lipase: 46 U/L (ref 11–51)

## 2020-01-29 LAB — PHOSPHORUS: Phosphorus: 3.4 mg/dL (ref 2.5–4.6)

## 2020-01-29 LAB — LACTIC ACID, PLASMA: Lactic Acid, Venous: 1.3 mmol/L (ref 0.5–1.9)

## 2020-01-29 LAB — I-STAT BETA HCG BLOOD, ED (MC, WL, AP ONLY): I-stat hCG, quantitative: <5 m[IU]/mL (ref <5)

## 2020-01-29 LAB — POC OCCULT BLOOD, ED: Fecal Occult Bld: POSITIVE — AB

## 2020-01-29 MED ORDER — PROPRANOLOL HCL 20 MG PO TABS: 20.0000 mg | ORAL_TABLET | Freq: Three times a day (TID) | ORAL | Status: DC

## 2020-01-29 MED ORDER — LACTATED RINGERS IV BOLUS
1000.0000 mL | Freq: Once | INTRAVENOUS | Status: AC
  Administered 2020-01-29: 1000 mL via INTRAVENOUS

## 2020-01-29 MED ORDER — POTASSIUM CHLORIDE 2 MEQ/ML IV SOLN
INTRAVENOUS | Status: AC
  Filled 2020-01-29 (×3): qty 1000

## 2020-01-29 MED ORDER — POTASSIUM CHLORIDE 10 MEQ/100ML IV SOLN
10.0000 meq | INTRAVENOUS | Status: AC
  Administered 2020-01-29 (×2): 10 meq via INTRAVENOUS
  Filled 2020-01-29 (×2): qty 100

## 2020-01-29 MED ORDER — LORAZEPAM 2 MG/ML IJ SOLN
1.0000 mg | Freq: Once | INTRAMUSCULAR | Status: AC
  Administered 2020-01-29: 1 mg via INTRAVENOUS
  Filled 2020-01-29: qty 1

## 2020-01-29 MED ORDER — NOREPINEPHRINE 4 MG/250ML-% IV SOLN
INTRAVENOUS | Status: AC
  Filled 2020-01-29: qty 250

## 2020-01-29 MED ORDER — SODIUM CHLORIDE 0.9 % IV BOLUS (SEPSIS)
1000.0000 mL | Freq: Once | INTRAVENOUS | Status: AC
  Administered 2020-01-29: 1000 mL via INTRAVENOUS

## 2020-01-29 MED ORDER — NOREPINEPHRINE 4 MG/250ML-% IV SOLN: 0.0000 ug/min | INTRAVENOUS | Status: DC

## 2020-01-29 MED ORDER — LACTATED RINGERS IV SOLN: INTRAVENOUS | Status: DC

## 2020-01-29 MED ORDER — ENSURE ENLIVE PO LIQD
237.0000 mL | Freq: Two times a day (BID) | ORAL | Status: DC
  Administered 2020-01-29: 237 mL via ORAL
  Filled 2020-01-29: qty 237

## 2020-01-29 MED ORDER — SODIUM CHLORIDE 0.9 % IV BOLUS
1000.0000 mL | Freq: Once | INTRAVENOUS | Status: AC
  Administered 2020-01-29: 1000 mL via INTRAVENOUS

## 2020-01-29 MED ORDER — POTASSIUM CHLORIDE CRYS ER 20 MEQ PO TBCR: 40.0000 meq | EXTENDED_RELEASE_TABLET | Freq: Once | ORAL | Status: DC

## 2020-01-29 MED ORDER — ONDANSETRON HCL 4 MG/2ML IJ SOLN
INTRAMUSCULAR | Status: AC
  Filled 2020-01-29: qty 2

## 2020-01-29 MED ORDER — SODIUM CHLORIDE 0.9 % IV SOLN
2.0000 g | INTRAVENOUS | Status: DC
  Administered 2020-01-29: 2 g via INTRAVENOUS
  Filled 2020-01-29 (×2): qty 20

## 2020-01-29 MED ORDER — LORAZEPAM 2 MG/ML IJ SOLN
0.5000 mg | Freq: Once | INTRAMUSCULAR | Status: AC
  Administered 2020-01-29: 0.5 mg via INTRAVENOUS
  Filled 2020-01-29: qty 1

## 2020-01-29 MED ORDER — DEXTROSE IN LACTATED RINGERS 5 % IV SOLN: INTRAVENOUS | Status: DC

## 2020-01-29 MED ORDER — MAGNESIUM SULFATE 2 GM/50ML IV SOLN
2.0000 g | Freq: Once | INTRAVENOUS | Status: AC
  Administered 2020-01-29: 2 g via INTRAVENOUS
  Filled 2020-01-29: qty 50

## 2020-01-29 MED ORDER — DEXTROSE 50 % IV SOLN
1.0000 | INTRAVENOUS | Status: DC | PRN
  Administered 2020-01-29: 50 mL via INTRAVENOUS
  Filled 2020-01-29: qty 50

## 2020-01-29 MED ORDER — LACTATED RINGERS IV BOLUS
500.0000 mL | Freq: Once | INTRAVENOUS | Status: AC
  Administered 2020-01-29: 500 mL via INTRAVENOUS

## 2020-01-29 MED ORDER — ALUM & MAG HYDROXIDE-SIMETH 200-200-20 MG/5ML PO SUSP: 15.0000 mL | Freq: Four times a day (QID) | ORAL | Status: DC | PRN

## 2020-01-29 MED ORDER — CALCIUM GLUCONATE 10 % IV SOLN
1.0000 g | Freq: Once | INTRAVENOUS | Status: AC
  Administered 2020-01-29: 1 g via INTRAVENOUS
  Filled 2020-01-29: qty 10

## 2020-01-29 MED ORDER — SODIUM CHLORIDE 0.9 % IV BOLUS
500.0000 mL | Freq: Once | INTRAVENOUS | Status: AC
  Administered 2020-01-29: 500 mL via INTRAVENOUS

## 2020-01-29 MED ORDER — KCL-LACTATED RINGERS-D5W 20 MEQ/L IV SOLN: INTRAVENOUS | Status: DC

## 2020-01-29 MED ORDER — METOPROLOL TARTRATE 5 MG/5ML IV SOLN
5.0000 mg | INTRAVENOUS | Status: DC | PRN
  Administered 2020-01-29: 5 mg via INTRAVENOUS
  Filled 2020-01-29: qty 5

## 2020-01-29 MED ORDER — POTASSIUM CHLORIDE 10 MEQ/100ML IV SOLN
10.0000 meq | INTRAVENOUS | Status: DC
  Filled 2020-01-29: qty 100

## 2020-01-29 MED ORDER — ENOXAPARIN SODIUM 40 MG/0.4ML ~~LOC~~ SOLN: 40.0000 mg | SUBCUTANEOUS | Status: DC

## 2020-01-29 MED ORDER — PROCHLORPERAZINE EDISYLATE 10 MG/2ML IJ SOLN
10.0000 mg | Freq: Four times a day (QID) | INTRAMUSCULAR | Status: DC | PRN
  Administered 2020-01-29 – 2020-01-30 (×2): 10 mg via INTRAVENOUS
  Filled 2020-01-29 (×4): qty 2

## 2020-01-29 NOTE — ED Notes (Addendum)
RN has paged MD Agyei about pt VS, bp and hr, awaiting call back at this time

## 2020-01-29 NOTE — ED Provider Notes (Signed)
[facility] EMERGENCY DEPARTMENT Provider Note   CSN: [id number] Arrival date & time: 01/29/20  6789     History Chief Complaint  Patient presents with  . Abdominal Pain    Rachel Fuller is a 39 y.o. female with pertinent past medical history of hypertension, heart failure (EF 55-60%), implanted pacemaker/defibrillator, cirrhosis, esophageal varices presents the emergency department today for nausea, vomiting, abdominal pain via EMS.  On arrival CBG was 39, EMS did give 25 g of D10, on arrival CBG 210.  Patient states that she has been having nausea, vomiting, abdominal pain for the past 3 weeks, states that she is also been having some tarry stools as well.  No diarrhea. Low PO intake. No bright red blood per rectum.  Denies any hematemesis.  Has not seen anyone, does have an appointment tomorrow with [facility] where she normally receives most of her care.  Also states that her defibrillator has been firing, last fire was yesterday while she was vomiting.  States that this morning she called EMS because she felt weak with dizziness.  States that abdominal pain is all over that radiates to her back.  Does not radiate anywhere else.  No chest pain, shortness of breath, numbness, tingling.  Does have history of A. fib, is not on a blood thinner.  No abdominal surgeries.  Last drink 1 year ago.  HPI     Past Medical History:  Diagnosis Date  . Cancer ([place])   . Cirrhosis ([place])   . Colon cancer ([place])   . Polycystic kidney disease, congenital   . TBI (traumatic brain injury) ([place])     There are no problems to display for this patient.   Past Surgical History:  Procedure Laterality Date  . ESOPHAGOGASTRODUODENOSCOPY W/ BANDING       OB History   No obstetric history on file.     No family history on file.  Social History   Tobacco Use  . Smoking status: Never Smoker  Substance Use Topics  . Alcohol use: Not Currently  . Drug use: Not  Currently    Home Medications Prior to Admission medications   Medication Sig Start Date End Date Taking? Authorizing Provider  propranolol (INDERAL) 20 MG tablet Take 20 mg by mouth 3 (three) times daily. 12/25/19 12/24/20 Yes [provider]  nicotine (NICODERM CQ - DOSED IN MG/24 HR) 7 mg/24hr patch Place 7 mg onto the skin daily. 11/14/19   [provider]  ondansetron (ZOFRAN ODT) 8 MG disintegrating tablet Take 1 tablet (8 mg total) by mouth every 8 (eight) hours as needed for nausea or vomiting. Patient not taking: Reported on 01/29/2020 12/24/19   Lacretia Leigh, MD  potassium chloride SA (KLOR-CON) 20 MEQ tablet Take 1 tablet (20 mEq total) by mouth 2 (two) times daily. Patient not taking: Reported on 38/01/1750 0/25/85   Delora Fuel, MD  prochlorperazine (COMPAZINE) 10 MG tablet Take 1 tablet (10 mg total) by mouth every 6 (six) hours as needed for nausea or vomiting. Patient not taking: Reported on 27/78/2423 5/36/14   Delora Fuel, MD    Allergies    Bee pollen, Clonidine derivatives, Gabapentin, Ivp dye [iodinated diagnostic agents], Shellfish-derived products, Doxycycline, Naprosyn [naproxen], Thelma Barge officinalis], Sulfa antibiotics, Tylenol [acetaminophen], Zofran [ondansetron], Motrin [ibuprofen], and Promethazine  Review of Systems   Review of Systems  Constitutional: Negative for chills, diaphoresis, fatigue and fever.  HENT: Negative for congestion, sore throat and trouble swallowing.   Eyes:  Negative for pain and visual disturbance.  Respiratory: Negative for cough, shortness of breath and wheezing.   Cardiovascular: Negative for chest pain, palpitations and leg swelling.  Gastrointestinal: Positive for blood in stool, nausea and vomiting. Negative for abdominal distention, abdominal pain, anal bleeding, constipation, diarrhea and rectal pain.  Genitourinary: Negative for difficulty urinating and pelvic pain.  Musculoskeletal: Negative for back pain,  neck pain and neck stiffness.  Skin: Negative for pallor.  Neurological: Positive for dizziness. Negative for speech difficulty, weakness and headaches.  Psychiatric/Behavioral: Negative for confusion.    Physical Exam Updated Vital Signs BP (!) 120/107   Pulse 76   Temp 97.6 F (36.4 C)   Resp (!) 27   Ht 5\' 2"  (1.575 m)   Wt 49.9 kg   LMP 01/03/2020   SpO2 100%   BMI 20.12 kg/m   Physical Exam Constitutional:      General: She is not in acute distress.    Appearance: Normal appearance. She is ill-appearing. She is not toxic-appearing or diaphoretic.  HENT:     Mouth/Throat:     Mouth: Mucous membranes are moist.     Pharynx: Oropharynx is clear.  Eyes:     General: No scleral icterus.    Extraocular Movements: Extraocular movements intact.     Pupils: Pupils are equal, round, and reactive to light.  Cardiovascular:     Rate and Rhythm: Normal rate and regular rhythm.     Pulses: Normal pulses.     Heart sounds: Normal heart sounds.  Pulmonary:     Effort: Pulmonary effort is normal. No respiratory distress.     Breath sounds: Normal breath sounds. No stridor. No wheezing, rhonchi or rales.  Chest:     Chest wall: No tenderness.  Abdominal:     General: Abdomen is flat. There is no distension.     Palpations: Abdomen is soft.     Tenderness: There is generalized abdominal tenderness. There is guarding. There is no right CVA tenderness, left CVA tenderness or rebound. Negative signs include Murphy's sign, Rovsing's sign, McBurney's sign, psoas sign and obturator sign.  Genitourinary:    Comments: Chaperone present. Digital Rectal exam reveals sphincter with good tone. No external hemorrhoids, masses, or fissures. Stool color is brown with no overt blood. No gross melena.   Musculoskeletal:        General: No swelling or tenderness. Normal range of motion.     Cervical back: Normal range of motion and neck supple. No rigidity.     Right lower leg: No edema.     Left  lower leg: No edema.  Skin:    General: Skin is warm and dry.     Capillary Refill: Capillary refill takes less than 2 seconds.     Coloration: Skin is not pale.  Neurological:     General: No focal deficit present.     Mental Status: She is alert and oriented to person, place, and time.  Psychiatric:        Mood and Affect: Mood normal.        Behavior: Behavior normal.     ED Results / Procedures / Treatments   Labs (all labs ordered are listed, but only abnormal results are displayed) Labs Reviewed  COMPREHENSIVE METABOLIC PANEL - Abnormal; Notable for the following components:      Result Value   Sodium 129 (*)    Potassium 2.8 (*)    Chloride 95 (*)    CO2 15 (*)  Glucose, Bld 233 (*)    AST 243 (*)    ALT 100 (*)    Alkaline Phosphatase 127 (*)    Total Bilirubin 5.2 (*)    Anion gap 19 (*)    All other components within normal limits  CBC WITH DIFFERENTIAL/PLATELET - Abnormal; Notable for the following components:   RBC 5.35 (*)    HCT 46.7 (*)    RDW 21.3 (*)    Platelets 131 (*)    All other components within normal limits  CBG MONITORING, ED - Abnormal; Notable for the following components:   Glucose-Capillary 241 (*)    All other components within normal limits  CBG MONITORING, ED - Abnormal; Notable for the following components:   Glucose-Capillary 159 (*)    All other components within normal limits  I-STAT VENOUS BLOOD GAS, ED - Abnormal; Notable for the following components:   pCO2, Ven 29.9 (*)    pO2, Ven 195.0 (*)    Bicarbonate 17.9 (*)    TCO2 19 (*)    Acid-base deficit 6.0 (*)    Sodium 129 (*)    Calcium, Ion 1.01 (*)    HCT 50.0 (*)    Hemoglobin 17.0 (*)    All other components within normal limits  CBG MONITORING, ED - Abnormal; Notable for the following components:   Glucose-Capillary 152 (*)    All other components within normal limits  POC OCCULT BLOOD, ED - Abnormal; Notable for the following components:   Fecal Occult Bld  POSITIVE (*)    All other components within normal limits  CBG MONITORING, ED - Abnormal; Notable for the following components:   Glucose-Capillary 112 (*)    All other components within normal limits  RESPIRATORY PANEL BY RT PCR (FLU A&B, COVID)  URINE CULTURE  CULTURE, BLOOD (ROUTINE X 2)  CULTURE, BLOOD (ROUTINE X 2)  LACTIC ACID, PLASMA  PROTIME-INR  APTT  LIPASE, BLOOD  MAGNESIUM  LACTIC ACID, PLASMA  URINALYSIS, ROUTINE W REFLEX MICROSCOPIC  TSH  COMPREHENSIVE METABOLIC PANEL  I-STAT BETA HCG BLOOD, ED ([facility], [facility], [facility] ONLY)  CBG MONITORING, ED  CBG MONITORING, ED  CBG MONITORING, ED  TYPE AND SCREEN  ABO/RH    EKG EKG Interpretation  Date/Time:  Tuesday January 29 2020 06:29:18 EDT Ventricular Rate:  98 PR Interval:    QRS Duration: 87 QT Interval:  447 QTC Calculation: 571 R Axis:   66 Text Interpretation: Atrial fibrillation Anteroseptal infarct, age indeterminate Prolonged QT interval No acute changes No significant change since last tracing Confirmed by Varney Biles [id number]) on 01/29/2020 6:54:06 AM   Radiology CT ABDOMEN PELVIS WO CONTRAST  Result Date: 01/29/2020 CLINICAL DATA:  Diffuse abdominal pain, hypoglycemia, hypertension EXAM: CT ABDOMEN AND PELVIS WITHOUT CONTRAST TECHNIQUE: Multidetector CT imaging of the abdomen and pelvis was performed following the standard protocol without IV contrast. COMPARISON:  None. FINDINGS: Lower chest: The visualized lung bases are clear bilaterally. Pacemaker leads are seen within the right heart. Global cardiac size within normal limits. No pericardial effusion. Hepatobiliary: Severe hepatic steatosis. Heterogeneous attenuation of the liver parenchyma, particularly within segment 4, is not well assessed on this examination due to lack of contrast administration but may represent areas of geographic fatty sparing, fibrosis, or infiltrative disease. The liver contour is nodular and there is hypertrophy of the left hepatic and  caudate lobes in keeping with cirrhosis. The main and intrahepatic portal veins are of normal contour on this noncontrast examination though patency is not well  assessed in absence of contrast administration. No intra or extrahepatic biliary ductal dilation. Gallbladder unremarkable. Pancreas: Unremarkable Spleen: Unremarkable Adrenals/Urinary Tract: Adrenal glands are unremarkable. Kidneys are normal, without renal calculi, focal lesion, or hydronephrosis. Bladder is unremarkable. Stomach/Bowel: Stomach is within normal limits. Appendix appears normal. No evidence of bowel wall thickening, distention, or inflammatory changes. No free intraperitoneal gas or fluid. Vascular/Lymphatic: Recanalization of the umbilical vein with superior epigastric and periumbilical venous collateralization in keeping with portal venous hypertension. The abdominal vasculature is otherwise unremarkable on this noncontrast examination. Reproductive: Uterus and bilateral adnexa are unremarkable. Other: Rectum unremarkable Musculoskeletal: No lytic or blastic bone lesions are seen. IMPRESSION: No definite radiographic explanation of the patient's reported symptoms. Severe fatty hepatic infiltration. Cirrhosis. Heterogeneous attenuation of the liver is not well assessed on this noncontrast examination with differential considerations listed above. Contrast enhanced MRI examination is recommended for further characterization once the patient's acute issues have resolved. Electronically Signed   By: Fidela Salisbury MD   On: 01/29/2020 09:12   DG Chest Port 1 View  Result Date: 01/29/2020 CLINICAL DATA:  Questionable sepsis. EXAM: PORTABLE CHEST 1 VIEW COMPARISON:  None. FINDINGS: Left chest wall pacing device with leads terminating over the right heart. Cardiomediastinal silhouette within normal limits. Both lungs are clear. No pneumothorax or pleural effusion. No acute osseous abnormality. IMPRESSION: No focal airspace disease.  Electronically Signed   By: Primitivo Gauze M.D.   On: 01/29/2020 08:05    Procedures .Critical Care Performed by: Alfredia Client, PA-C Authorized by: Alfredia Client, PA-C   Critical care provider statement:    Critical care time (minutes):  45   Critical care was time spent personally by me on the following activities:  Discussions with consultants, evaluation of patient's response to treatment, examination of patient, ordering and performing treatments and interventions, ordering and review of laboratory studies, ordering and review of radiographic studies, pulse oximetry, re-evaluation of patient's condition, obtaining history from patient or surrogate and review of old charts  Ultrasound ED Peripheral IV (Provider)  Date/Time: 01/29/2020 7:31 AM Performed by: Alfredia Client, PA-C Authorized by: Alfredia Client, PA-C   Procedure details:    Indications: multiple failed IV attempts and poor IV access     Skin Prep: isopropyl alcohol     Location:  Left AC   Angiocath:  20 G   Patient tolerated procedure without complications: Yes     Dressing applied: Yes     (including critical care time)  Medications Ordered in ED Medications  potassium chloride 10 mEq in 100 mL IVPB (10 mEq Intravenous New Bag/Given 01/29/20 1059)  sodium chloride 0.9 % bolus 1,000 mL (0 mLs Intravenous Stopped 01/29/20 0811)  LORazepam (ATIVAN) injection 0.5 mg (0.5 mg Intravenous Given 01/29/20 0807)  sodium chloride 0.9 % bolus 1,000 mL (0 mLs Intravenous Stopped 01/29/20 0853)  sodium chloride 0.9 % bolus 500 mL (0 mLs Intravenous Stopped 01/29/20 1106)  lactated ringers bolus 1,000 mL (1,000 mLs Intravenous New Bag/Given 01/29/20 1104)    ED Course  I have reviewed the triage vital signs and the nursing notes.  Pertinent labs & imaging results that were available during my care of the patient were reviewed by me and considered in my medical decision making (see chart for details).    MDM  Rules/Calculators/A&P                          Rachel Fuller is a 39 y.o. female with pertinent  past medical history of hypertension, heart failure, implanted pacemaker/defibrillator, cirrhosis, esophageal varices presents the emergency department today for nausea, vomiting, abdominal pain via EMS.  On arrival core temp was 89.5.  Bear hugger placed and warm fluids running, sepsis work-up initiated.  Physical exam benign, patient appears ill, however no point focal tenderness on abdominal exam, patient's blood pressure 95/67, pulse 96, satting at 100% on room air, does not appear to be in distress.  Differential to include sepsis due to abdominal pathology, GI bleed, electrolyte abnormality, myxedema coma, hypoglycemia causing hypothermia due to nausea and vomiting for the past 3 weeks.  We will hold off on IV antibiotics since patient is not hypotensive, not tachycardic and we do not have any labs back yet. Not actively vomiting.  Normal neuro exam.  We will initiate warmed IV fluids, constant CBG monitoring, and perform digital rectal.  We will also interrogate pacemaker.  Ativan given for nausea, patient has prolonged QTC.  Will hold off on opioids/tylenol right now since patient has history of cirrhosis.    Labs show no leukocytosis or leukopenia, normal hemoglobin.  CMP remarkable for sodium of 129, potassium 2.8 and elevated transaminases with a gap of 19.  Will replete potassium here now.  INR normal, platelets 131.  Lactic acid 1.3.  Fecal occult positive, digital rectal exam with no gross blood.  Upon reassessment patient has gotten 2 L and systolic is remained in the 80s, does frequently drop to the 70s.   We will consult critical care at this time. Levophed has not been started yet.   1048 spoke to critical care, they will come to assess her.  We will hold off on Levophed and start one more L of fluids.  [place] Dr. Sherrin Daisy.,  Critical care who thinks that patient can be admitted to  hospitalist, they will consult.  Upon reassessment patient's systolic in the 67T, core temp 96.6.  1151 spoke to Dr. Truman Hayward, internal medicine who will accept the patient.  Patient systolic 245Y, core temp 09.9.  Patient appears more stabilized.  The patient appears reasonably stabilized for admission considering the current resources, flow, and capabilities available in the ED at this time, and I doubt any other [facility] requiring further screening and/or treatment in the ED prior to admission. Still waiting on  interrogation of pacemaker.   I think patient symptoms most likely coming from low p.o. intake, causing hypoglycemia causing hypothermia also causing hypotension.  Patient responding well to third liter of fluids.  I discussed this case with my attending physician who cosigned this note including patient's presenting symptoms, physical exam, and planned diagnostics and interventions. Attending physician stated agreement with plan or made changes to plan which were implemented.   Attending physician assessed patient at bedside.  Final Clinical Impression(s) / ED Diagnoses Final diagnoses:  Hypothermia, initial encounter  Generalized abdominal pain  Hypoglycemia    Rx / DC Orders ED Discharge Orders    None       Alfredia Client, PA-C 01/29/20 [place], Ankit, MD 01/30/20 [phone]

## 2020-01-29 NOTE — ED Notes (Signed)
Pt placed on bair hugger due to low core temp

## 2020-01-29 NOTE — Consult Note (Addendum)
Consulted for AF with RVR in this 39 year old woman with alcohol-induced cardiomyopathy with recovered LVEF and decompensated alcoholic cirrhosis with history of variceal bleed. She was admitted for acute on chronic abdominal pain with intractable nausea and vomiting, hypoglycemia, hypothermia, and elevated transaminases. She was given IV metoprolol and became hypotensive with only transient improvement in heart rate from 140s to 110s. We discussed continued volume resuscitation, then oral beta blockade for rate control with digoxin IV in the interim while rate is still significantly elevated. However, shortly after she converted to sinus rhythm. I am told she is not a candidate for anticoagulation due to history of bleeding varices. Can continue ORAL beta blocker as tolerated for rate control, avoid IV. If any further issues are happy to provide further assistance.

## 2020-01-29 NOTE — ED Provider Notes (Signed)
39 year old female with history of alcoholic liver cirrhosis, A. Fib on propanolol comes in a chief complaint of low blood glucose.  According to patient, she called out for help and her roommate called EMS.  Patient was noted to have blood sugar in the 30s per EMS.  She is currently oriented x3.  She was diaphoretic.  She is noted to be hypothermic and in A. fib with RVR.  Patient states that she been having 2 weeks of diarrhea, abdominal pain, nausea and vomiting.  She has known history of esophageal varices.  She has not had any large bloody vomitus.  She does indicate that her stools are dark.  She is having 5-10 episodes of emesis and around 5 episodes of loose BM.  She is not eating well which likely has contributed to hypoglycemia.  Patient denies any UTI-like symptoms, new cough, fevers.  She is having abdominal pain that is generalized and has been present for the last 2 weeks.  She has not had paracentesis in more than 1 year.  Patient is hypoglycemia is likely led to the profound hypothermia.  She is on a [employer].  Patient's hypoglycemia is because of reduced p.o. intake and malnutrition.  Clinical suspicion is low for underlying infection at this time, but we will get CT scan.  We suspect that patient will likely have electrolyte abnormalities and possibly renal failure.  If there is renal failure then we will give her some albumin.  Shock index greater than 1 but patient is not hypotensive.  She will be admitted once workup is completed.   Varney Biles, MD 01/29/20 [phone]

## 2020-01-29 NOTE — ED Notes (Signed)
Bair hugger setting decreased to medium setting. Patient is alert, oriented, and talking on her cell phone to support person. Patient with left AC PIV infusing well. Right arm PIV placed by IV team with U/S. Patient tolerating well. Foley to SD.

## 2020-01-29 NOTE — ED Notes (Signed)
MD  Has returned page and placed orders

## 2020-01-29 NOTE — ED Triage Notes (Signed)
Pt brought to ED via EMS from home with c/o abd pain accompanied by n/v/d x 3 weeks. CBG 39 on arrival to home, 25g  D10 given by EMS; last CBG 210 PTA.  Per EMS, hx of pacemaker, CHF, liver failure, esophageal varices. Pt currently A&Ox4, diaphoretic; endorses abd pain, nausea. CBG 241 in ED.  EMS v/s: 148/100 90 pulse 18 RR 99% RA

## 2020-01-29 NOTE — H&P (Signed)
Date: 01/29/2020               Patient Name:  Rachel Fuller MRN: [id number]  DOB: 09/29/1980 Age / Sex: 39 y.o., female   PCP: [facility], [place] Service: Internal Medicine Teaching Service         Attending Physician: Dr. Heber [place], Rachel Moulds, DO    First Contact: Dr. Alexandria Lodge Pager: [phone]  Second Contact: Dr. Maudie Mercury Pager: [phone]       After Hours (After 5p/  First Contact Pager: [phone]  weekends / holidays): Second Contact Pager: [phone]   Chief Complaint: Nausea, vomiting  History of Present Illness:  Rachel Fuller is a 39 yo F w/ PMH of alcoholic cardiomyopathy, systolic heart failure (EF 25-30%, now normalized to 60% on last echo) w/ ICD, alcoholic cirrhosis, esophageal varices, prior hx of alcohol abuse presenting to [facility] w/ complaint of intractable nausea and vomiting. She was in her usual state of health until month ago when she began to have acute on chronic abdominal pain with nausea and vomiting. She was trying to manage her symptoms at home with zofran and compazine but had no improvement. She was able to keep liquids down if she drinks small amounts. She denies any fevers, hematemesis or biliary vomitus but mentions having some melena with last episode this am.  Chart review shows hx of significant hepatic disease which she confirms. She mentions having prior hx of variceal bleed with multiple EGDs requiring banding. She also mentions hx of recurrent episodes of decompensated cirrhosis requiring cirrhosis. She was previously on rifaximin, spironolactone, furosemide and lactulose in the past but these were discontinued due to frequent nausea and vomiting. She was also on multiple anti-emetics such as zofran, reglan and compazine but these were discontinued as well due to QT prolongation. She mentions currently only taking propranolol.  Meds:  Current Meds  Medication Sig  . propranolol (INDERAL) 20 MG tablet Take 20 mg by mouth 3 (three)  times daily.    Allergies: Allergies as of 01/29/2020 - Review Complete 01/29/2020  Allergen Reaction Noted  . Bee pollen Anaphylaxis 05/18/2016  . Clonidine derivatives Shortness Of Breath and Rash 01/29/2020  . Gabapentin Rash 12/24/2019  . Ivp dye [iodinated diagnostic agents] Anaphylaxis 12/24/2019  . Shellfish-derived products Anaphylaxis 07/16/2017  . Doxycycline Hives 12/24/2019  . Naprosyn [naproxen] Nausea And Vomiting 01/29/2020  . Sage [salvia officinalis] Swelling 01/29/2020  . Sulfa antibiotics Other (See Comments) 12/24/2019  . Tylenol [acetaminophen]  12/24/2019  . Zofran [ondansetron] Nausea And Vomiting and Swelling 01/29/2020  . Motrin [ibuprofen] Nausea And Vomiting 12/24/2019  . Promethazine Rash 11/01/2019   Past Medical History:  Diagnosis Date  . Cancer ([place])   . Cirrhosis ([place])   . Colon cancer ([place])   . Polycystic kidney disease, congenital   . TBI (traumatic brain injury) ([place])     Family History:  Both grandparents had early heart disease. Grandfather had colon cancer  Social History: Currently lives with a friend. On disability. Mentions prior hx of significant alcohol abuse requiring inpatient detox. Currently >1 year sober. Smokes 1/4 pack daily for 20 years. Denies illicit drug use.  Review of Systems: A complete ROS was negative except as per HPI.  Physical Exam: Blood pressure 95/62, pulse 74, temperature 98.2 F (36.8 C), resp. rate 20, height 5\' 2"  (1.575 m), weight 49.9 kg, last menstrual period 01/03/2020, SpO2 100 %.  Gen: Well-developed, chronically ill-appearing, NAD  HEENT: NCAT head, hearing intact, EOMI, Icteric sclerae, dry mucous membrane Neck: supple, ROM intact, no JVD CV: RRR, S1, S2 normal, No rubs, no murmurs, no gallops Pulm: CTAB, No rales, no wheezes, no dullness to percussion  Abd: Soft, BS+, Diffuse tenderness to palpation, no rebound, no guarding Extm: ROM intact, Peripheral pulses intact, No peripheral  edema Skin: Dry, Warm, poor turgor Neuro: AAOx3, No asterixis  EKG: personally reviewed my interpretation is prolonged QT interval, irregularly irregular  CXR: personally reviewed my interpretation is   Assessment & Plan by Problem: Active Problems:   Decompensated hepatic cirrhosis ([place])  Rachel Fuller is a 39 yo F w/ PMH of systolic heart failure, alcoholic cirrhosis, alcoholic cardiomyopathy presenting to [facility] w/ hypovolemic shock due to intractable nausea and vomiting.  Hypovolemic Shock Hypothermia Hypoglycemia Per EMS, on arrival cbg in 65s. On admission noted to have bp of 83/66 with temp of 90.9.  Have not had adequate oral intake for 4 weeks due to intractable nausea and vomiting. Constellation of symptoms concerning for sepsis but no leukocytosis or obvious source of infection. Had prior hx of GI bleed but rectal exam per EDP without active GI bleed. Abdominal exam with tenderness concerning for SBP in setting of diagnosis of cirrhosis. Will start ceftriaxone and treat w/ supportive care for now. - F/u cultures - Start ceftriaxone - LR 125cc/hr - Start clears - Glucose monitoring for hypoglycemia - Monitor for hypervolemia as hx of HFrEF and cirrhosis predisposes her to become volume overloaded  Starvation ketoacidosis Hypokalemia Hypovolemic hyponatremia Again likely due to poor oral intake. Mentions 'barely eating' for last 4 weeks. K 2.8 Na 129. Ketonuria Co2 15 with Gap of 19. Lactate 1.3 Delta-delta of 0.78 consistent with high + normal gap metabolic acidosis. Considering prior history, methanol or ethanol consumption also possibility but noted to be unable to tolerate oral intake when testing sips at bedside. - Check ethanol level - F/u CMP - Replete electrolytes as needed - Monitor phos as high risk of refeeding syndrome  Transaminitis Alcoholic Cirrhosis History of heavy alcohol use w/ admissions for delirium tremens. Known cirrhosis with sequelae including variceal  hemorrhage and recurrent hepatic encephalopathy. MELD score of 22. Mentions having been sober for 1 year. AST 243, ALT 100. Ratio suggesting alcohol use but could be due to severe shock. Does not appear intoxicated. Ethanol level pending. Maddrey's Discriminant Function score of 19.9. No indication for steroid therapy.  - C/w monitor - F/u ethanol  Chronic systolic heart failure Echo in 11/09/2019 with EF of 25-30%. Thought to be due to cardiac arrest during prior hospitalization and alcohol use. ICD placed by [facility]. Current appear hypovolemic. Not on ace inhibitor or aldosterone agonist. On propranolol for known varices.  - Holding antihypertensives in setting of hypotension - I&Os, Daily weights  New Onset Atrial Fibrillation On admission noted to be in atrial fibrillation. On propranolol at home for varices. Likely driven by acute illness. Not in RVR with current HR in 90s. CHADSVASC2 score of 2. Has-Bled score of 3 due to prior bleed, cirrhosis. Not a great candidate for anti-coagulation.  - C/w home beta blocker: propranolol 20mg  TID - Telemetry  Positive FOBT test Hx of esophageal varices Reports 1 episode of melena this am. FOBT positive. Hemoglobin wnl at 14.4. No active GI bleed per EDP rectal exam - Trend cbc  DVT prophx: SCDs Diet: Clears Bowel: N/A Code: Full  Prior to Admission Living Arrangement: Home Anticipated Discharge Location: SNF Barriers to Discharge: Medical treatment  Dispo: Admit patient to Inpatient with expected length of stay greater than 2 midnights.  Signed: Mosetta Anis, MD 01/29/2020, 2:53 PM  Pager: [phone]

## 2020-01-29 NOTE — ED Notes (Signed)
ICU Provider at bedside.

## 2020-01-29 NOTE — Consult Note (Signed)
NAME:  Rachel Fuller, MRN:  [id number], DOB:  [birth year], LOS: 0 ADMISSION DATE:  01/29/2020, CONSULTATION DATE:  01/29/20 REFERRING MD:  Kathrynn Humble  CHIEF COMPLAINT:  Hypothermia, Hypotension, Hypoglycemia   Brief History   Rachel Fuller is a 39 y.o. female who presented to [facility] ED 10/19 with hypothermia, hypotension, hypoglycemia.  She had been feeling poorly for 3 weeks with almost no PO intake due to nausea, vomiting, diarrhea (also has hx of cyclic vomiting syndrome). PCCM asked to see due to borderline BP.  History of present illness   Rachel Fuller is a 39 y.o. 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  She presented to [facility] ED 10/19 with hypothermia, hypoglycemia, hypotension.  She states she has had generalized weakness for the past 3 weeks with almost zero PO intake due to nausea, vomiting, diarrhea.  She does carry a hx of cyclic vomiting syndrome but states this has been much worse to the point she cant even keep any water down.  She has force herself to continue taking her meds during this time which does include propranolol (and per [facility] notes was supposed to be on lasix and spironolactone for ascites, unclear whether she has been taking these).  She denies any fevers, chest pain, exposures to known sick contacts, recent travel.  She does state she had chills earlier and also has ongoing abd pain, nausea, vomiting, diarrhea.  She denies any recent abx use.    On EMS arrival, CBG was 39.  She received 25g D10 with improvement of CBG to 210.  Initial SBP in ED was in low 90s with temp 89.5.  She was started on IVF and placed on a bear hugger.  After 2.5L, SBP remained in 90's; therefore, PCCM was asked to see in consultation.  CT A / P was negative for acute process.  At the time of our  evaluation, MAP was 77 and pt still appears volume down.  She does state that she certainly feels better then she did earlier, but is not yet back to baseline.     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  Significant Hospital Events   10/19 > admit.  Consults:  PCCM.  Procedures:  None.  Significant Diagnostic Tests:  CT A / P 10/19 > severe fatty hepatic infiltration.  Cirrhosis.  Micro Data:  COVID 10/19 > neg. Flu 10/19 > neg.  Antimicrobials:  None.   Interim history/subjective:  Feels a bit better. MAP 77. Still dry.  Objective:  Blood pressure (!) 120/107, pulse 76, temperature 97.6 F (36.4 C), resp. rate (!) 27, height 5\' 2"  (1.575 m), weight 49.9 kg, last menstrual period 01/03/2020, SpO2 100 %.        Intake/Output Summary (Last 24 hours) at 01/29/2020 1202 Last data filed at 01/29/2020 1106 Gross per 24 hour  Intake 2600 ml  Output 400 ml  Net 2200 ml   Filed Weights   01/29/20 0624  Weight: 49.9 kg    Examination: General: Adult female, in NAD. Neuro: A&O x 3, no deficits. HEENT: [place]/AT. Sclerae anicteric.  EOMI.  MM dry. Cardiovascular: Tachy, regular, no M/R/G.  Lungs:  Respirations even and unlabored.  CTA bilaterally, No W/R/R.  Abdomen: Obese.  BS x 4, soft.  Tender to deep palpation. Musculoskeletal: No gross deformities, no edema.  Skin: Intact, warm, no rashes.  Assessment & Plan:   Hypotension - hypovolemic with essentially no PO intake x 3 weeks.  Gradually responding to fluids.  Lactate reassuring.  NOT felt to be infectious / septic. - Continue aggressive IVF resuscitation, currently on 3rd liter.  Would consider additional 500cc-1000cc bolus afterwards then MIVF @ 100. - No role for vasopressors at this point.  Hypothermia - unclear etiology. - F/u on TSH. - Continue bear hugger.  Hypoglycemia  - presumed 2/2 decreased PO intake. - Follow CBG's and give dextrose as needed.  Hyponatremia - hypovolemic. Hypokalemia - exacerbated by vomiting / diarrhea.  Now s/p repletion. Hypomagnesemia. - Continue fluids. - 2g Mag now. - Maintain K > 4, Mg > 2 given hx of VT. - Follow BMP.  Hypocalcemia. - 1g Ca gluconate.  Nausea / vomiting / diarrhea - ? Viral gastritis superimposed on underlying cyclic vomiting syndrome.  No recent abx use so low suspicion for C.diff.  Nausea / vomiting improved with ativan.  Unable to give anti-emetic due to prolonged QTc. - Supportive care. - Advance diet as able.  Prolonged QTc (570). - Maintain K > 4, Mg > 2 given hx of VT.  No role for ICU admission.  OK to admit to med surge vs progressive under TRH.  Nothing further to add.  PCCM will sign off.  Please do not hesitate to call us back if we can be of any further assistance.   Labs   CBC: Recent Labs  Lab 01/29/20 0644 01/29/20 0719  WBC 5.9  --   NEUTROABS 3.8  --   HGB 14.4 17.0*  HCT 46.7* 50.0*  MCV 87.3  --   PLT 131*  --    Basic Metabolic Panel: Recent Labs  Lab 01/29/20 0644 01/29/20 0655 01/29/20 0719  NA 129*  --  129*  K 2.8*  --  4.0  CL 95*  --   --   CO2 15*  --   --   GLUCOSE 233*  --   --   BUN 8  --   --   CREATININE 0.73  --   --   CALCIUM 9.0  --   --   MG  --  1.8  --    GFR: Estimated Creatinine Clearance: 73.6 mL/min (by C-G formula based on SCr of 0.73 mg/dL). Recent Labs  Lab 01/29/20 0644  WBC 5.9  LATICACIDVEN 1.3   Liver Function Tests: Recent Labs  Lab 01/29/20 0644  AST 243*  ALT 100*  ALKPHOS 127*  BILITOT 5.2*  PROT 7.2  ALBUMIN 3.8   Recent Labs  Lab 01/29/20 0644  LIPASE 46   No results for input(s): AMMONIA in the last 168 hours. ABG    Component Value Date/Time   HCO3 17.9 (L) 01/29/2020 0719   TCO2 19 (L) 01/29/2020 0719   ACIDBASEDEF 6.0 (H) 01/29/2020 0719   O2SAT 100.0 01/29/2020 0719    Coagulation  Profile: Recent Labs  Lab 01/29/20 0644  INR 1.2   Cardiac Enzymes: No results for input(s): CKTOTAL, CKMB, CKMBINDEX, TROPONINI in the last 168 hours. HbA1C: No results found for: HGBA1C CBG: Recent Labs  Lab 01/29/20 0805 01/29/20 0927 01/29/20 1029 01/29/20 1107 01/29/20 1136  GLUCAP 159* 152* 112* 92 85    Review of Systems:  All negative; except for those that are bolded, which indicate positives.  Constitutional: weight loss, weight gain, night sweats, fevers, chills, fatigue, weakness.  HEENT: headaches, sore throat, sneezing, nasal congestion, post nasal drip, difficulty swallowing, tooth/dental problems, visual complaints, visual changes, ear aches. Neuro: difficulty with speech, weakness, numbness, ataxia. CV:  chest pain, orthopnea, PND, swelling in lower extremities, dizziness, palpitations, syncope.  Resp: cough, hemoptysis, dyspnea, wheezing. GI: heartburn, indigestion, abdominal pain, nausea, vomiting, diarrhea, constipation, change in bowel habits, loss of appetite, hematemesis, melena, hematochezia.  GU: dysuria, change in color of urine, urgency or frequency, flank pain, hematuria. MSK: joint pain or swelling, decreased range of motion. Psych: change in mood or affect, depression, anxiety, suicidal ideations, homicidal ideations. Skin: rash, itching, bruising.   Past medical history  She,  has a past medical history of Cancer ([place]), Cirrhosis ([place]), Colon cancer ([place]), Polycystic kidney disease, congenital, and TBI (traumatic brain injury) ([place]).   Surgical History    Past Surgical History:  Procedure Laterality Date   ESOPHAGOGASTRODUODENOSCOPY W/ BANDING       Social History   reports that she has never smoked. She does not have any smokeless tobacco history on file. She reports previous alcohol use. She reports previous drug use.   Family history   Her family history is not on file.   Allergies Allergies  Allergen Reactions   Bee Pollen  Anaphylaxis   Clonidine Derivatives Shortness Of Breath and Rash   Gabapentin Rash    Pt felt weird   Ivp Dye [Iodinated Diagnostic Agents] Anaphylaxis   Shellfish-Derived Products Anaphylaxis   Doxycycline Hives   Naprosyn [Naproxen] Nausea And Vomiting   Thelma Barge Officinalis] Swelling   Sulfa Antibiotics Other (See Comments)    Respiratory distress   Tylenol [Acetaminophen]    Zofran [Ondansetron] Nausea And Vomiting and Swelling   Motrin [Ibuprofen] Nausea And Vomiting   Promethazine Rash     Home meds  Prior to Admission medications   Medication Sig Start Date End Date Taking? Authorizing Provider  propranolol (INDERAL) 20 MG tablet Take 20 mg by mouth 3 (three) times daily. 12/25/19 12/24/20 Yes [provider]  nicotine (NICODERM CQ - DOSED IN MG/24 HR) 7 mg/24hr patch Place 7 mg onto the skin daily. 11/14/19   [provider]  ondansetron (ZOFRAN ODT) 8 MG disintegrating tablet Take 1 tablet (8 mg total) by mouth every 8 (eight) hours as needed for nausea or vomiting. Patient not taking: Reported on 01/29/2020 12/24/19   Lacretia Leigh, MD  potassium chloride SA (KLOR-CON) 20 MEQ tablet Take 1 tablet (20 mEq total) by mouth 2 (two) times daily. Patient not taking: Reported on 29/93/7169 6/78/93   Delora Fuel, MD  prochlorperazine (COMPAZINE) 10 MG tablet Take 1 tablet (10 mg total) by mouth every 6 (six) hours as needed for nausea or vomiting. Patient not taking: Reported on 81/04/7508 2/58/52   Delora Fuel, MD     Montey Hora, [facility] 01/29/2020, 12:02 PM

## 2020-01-29 NOTE — Significant Event (Addendum)
I received a page from the nurse regarding patient going into atrial fibrillation with RVR.  On assessment of the patient in the emergency department, she did not have any signs of hemodynamic instability.  She was in acute distress due to ongoing nausea and vomiting.  She did have several episodes of nonbilious nonbloody emesis while I was in the room.  An EKG was obtained which confirmed atrial fibrillation with HR 151.  Blood pressure was checked and was 156/97 with MAP 117.  Once patient calmed down, her heart rate was 128.  Repeat blood pressure was 123/82 with a heart rate in the 130s.  I did write an order for IV metoprolol 5 mg x 1 dose with goal heart rate less than 110.  An order was also written for Compazine and IV Ativan.  Depending on patient's response to IV metoprolol, I will make a decision whether to start her on Cardizem infusion depending on her blood pressure.  We are limited by the use of amiodarone due to her hepatic dysfunction.  If challenges arise controlling patient's atrial fibrillation, will reach out to cardiology for assistance and possibly starting digoxin for rate control.  ADDENDUM:  Paged about  Patients vitals BP 77/55, HR 107-125. Will give patient 1L NS bolus and reach out to Cardiology.  I was able to follow-up and assess patient.  She was sleeping comfortably without signs of altered cognition or hemodynamic compromise such as cardiogenic shock.  Her IV fluid bolus had just started running and repeated blood pressure was 84/51.  Her heart rate was between 109-130.  As patient does not have any sign of hemodynamic compromise, I do not believe it is appropriate to initiate direct cardioversion.  Upon speaking with cardiology, they agree with IV fluid bolus to help improve her blood pressure.  Other options include digoxin or low-dose amiodarone.  We will keep a close eye on patient after IV fluid bolus.  Given her persistent nausea and vomiting, it will be challenging to  start oral metoprolol. Her Bps had been in the 70s-100s/60s-100s throughout the day.  ADDENDUM: Patient is currently resting comfortably.  Heart rate 88, BP 88/57 with MAP of 71.  Again, no sign of hemodynamic instability or shock. Will continue IVF    Nathanial Rancher, MD [phone] Internal Medicine Teaching Service

## 2020-01-30 ENCOUNTER — Encounter (HOSPITAL_COMMUNITY): Payer: Self-pay | Admitting: Internal Medicine

## 2020-01-30 DIAGNOSIS — E162 Hypoglycemia, unspecified: Secondary | ICD-10-CM

## 2020-01-30 DIAGNOSIS — I4891 Unspecified atrial fibrillation: Secondary | ICD-10-CM

## 2020-01-30 LAB — BLOOD CULTURE ID PANEL (REFLEXED) - BCID2

## 2020-01-30 LAB — COMPREHENSIVE METABOLIC PANEL
ALT: 64 U/L — ABNORMAL HIGH (ref 0–44)
AST: 95 U/L — ABNORMAL HIGH (ref 15–41)
Albumin: 2.8 g/dL — ABNORMAL LOW (ref 3.5–5.0)
Alkaline Phosphatase: 83 U/L (ref 38–126)
Anion gap: 8 (ref 5–15)
BUN: <5 mg/dL — ABNORMAL LOW (ref 6–20)
CO2: 21 mmol/L — ABNORMAL LOW (ref 22–32)
Calcium: 8.1 mg/dL — ABNORMAL LOW (ref 8.9–10.3)
Chloride: 106 mmol/L (ref 98–111)
Creatinine, Ser: 0.64 mg/dL (ref 0.44–1.00)
GFR, Estimated: >60 mL/min (ref >60)
Glucose, Bld: 147 mg/dL — ABNORMAL HIGH (ref 70–99)
Potassium: 3.1 mmol/L — ABNORMAL LOW (ref 3.5–5.1)
Sodium: 135 mmol/L (ref 135–145)
Total Bilirubin: 2.1 mg/dL — ABNORMAL HIGH (ref 0.3–1.2)
Total Protein: 5.1 g/dL — ABNORMAL LOW (ref 6.5–8.1)

## 2020-01-30 LAB — CBC
HCT: 34.9 % — ABNORMAL LOW (ref 36.0–46.0)
Hemoglobin: 11.5 g/dL — ABNORMAL LOW (ref 12.0–15.0)
MCH: 28.2 pg (ref 26.0–34.0)
MCHC: 33 g/dL (ref 30.0–36.0)
MCV: 85.5 fL (ref 80.0–100.0)
Platelets: 87 10*3/uL — ABNORMAL LOW (ref 150–400)
RBC: 4.08 MIL/uL (ref 3.87–5.11)
RDW: 21.2 % — ABNORMAL HIGH (ref 11.5–15.5)
WBC: 5.4 10*3/uL (ref 4.0–10.5)
nRBC: 0 % (ref 0.0–0.2)

## 2020-01-30 LAB — URINE CULTURE: Culture: NO GROWTH

## 2020-01-30 LAB — CBG MONITORING, ED: Glucose-Capillary: 150 mg/dL — ABNORMAL HIGH (ref 70–99)

## 2020-01-30 LAB — ABO/RH: ABO/RH(D): O POS

## 2020-01-30 LAB — GLUCOSE, CAPILLARY: Glucose-Capillary: 135 mg/dL — ABNORMAL HIGH (ref 70–99)

## 2020-01-30 LAB — PROTIME-INR
INR: 1.4 — ABNORMAL HIGH (ref 0.8–1.2)
Prothrombin Time: 16.4 seconds — ABNORMAL HIGH (ref 11.4–15.2)

## 2020-01-30 MED ORDER — KETOROLAC TROMETHAMINE 15 MG/ML IJ SOLN
15.0000 mg | Freq: Once | INTRAMUSCULAR | Status: AC
  Administered 2020-01-30: 15 mg via INTRAVENOUS
  Filled 2020-01-30: qty 1

## 2020-01-30 MED ORDER — NICOTINE 7 MG/24HR TD PT24: 7.0000 mg | MEDICATED_PATCH | Freq: Every day | TRANSDERMAL | Status: DC

## 2020-01-30 MED ORDER — POTASSIUM CHLORIDE 20 MEQ PO PACK: 20.0000 meq | PACK | Freq: Two times a day (BID) | ORAL | Status: DC

## 2020-01-30 MED ORDER — CHLORHEXIDINE GLUCONATE CLOTH 2 % EX PADS: 6.0000 | MEDICATED_PAD | Freq: Every day | CUTANEOUS | Status: DC

## 2020-01-30 NOTE — Progress Notes (Signed)
Pt was A&Ox4 walking in the hall way with PT after her assessment she asked nurse if she could get the paper work for [facility] because she would like to leave. Pt then stated " it nothing anyone did here I got a lot going on I just got some bad news and I need to leave right now. I notified DO by SChat.  Pt got dressed and packed all belongings signed [place]

## 2020-01-30 NOTE — Progress Notes (Signed)
Pt arrived to 2w21 via stretcher. Received report from [employer]. Pt is A&O x 4, has IV x 3, foley cath in place, fluids running at 100 ml/hr. Pt oriented to unit and given call bell.

## 2020-01-30 NOTE — Plan of Care (Signed)

## 2020-01-30 NOTE — Evaluation (Signed)
Physical Therapy Evaluation - One time  Patient Details Name: Rachel Fuller MRN: [id number] DOB: 06-11-1980 Today's Date: 01/30/2020   History of Present Illness  39 yo F w/ PMH of alcoholic cardiomyopathy, systolic heart failure (EF 25-30%, now normalized to 60% on last echo) w/ ICD, alcoholic cirrhosis, TBI, colon cancer, esophageal varices, prior hx of alcohol abuse presenting to [facility] on 10/19 w/ complaint of intractable nausea and vomiting, as well as + defibrillator firing. Pt found to be in hypovolemic shock, afib with RVR now spontaneously converted to sinus, and heme + stool.  Clinical Impression   Pt presents with [facility] strength, balance, activity tolerance, and mobility on PT eval. Pt ambulated 500+ feet in hallway without AD, and scored 24/24 on DGI demonstrating no deficits in dynamic gait. Pt feels she is at baseline level of functioning, lives with a roommate and does not require any PT follow up. PT to sign off, no further acute PT needs.      Follow Up Recommendations No PT follow up    Equipment Recommendations  None recommended by PT    Recommendations for Other Services       Precautions / Restrictions Precautions Precautions: None Restrictions Weight Bearing Restrictions: No      Mobility  Bed Mobility Overal bed mobility: Independent;Modified Independent             General bed mobility comments: for increased time    Transfers Overall transfer level: Modified independent               General transfer comment: for increased time to rise  Ambulation/Gait Ambulation/Gait assistance: Modified independent (Device/Increase time) Gait Distance (Feet): 500 Feet Assistive device: None Gait Pattern/deviations: Step-through pattern;WFL(Within Functional Limits) Gait velocity: WFL   General Gait Details: WFL gait, no deviations even when challenged  Science writer    Modified Rankin (Stroke Patients Only)        Balance Overall balance assessment: Independent                               Standardized Balance Assessment Standardized Balance Assessment : Dynamic Gait Index   Dynamic Gait Index Level Surface: Normal Change in Gait Speed: Normal Gait with Horizontal Head Turns: Normal Gait with Vertical Head Turns: Normal Gait and Pivot Turn: Normal Step Over Obstacle: Normal Step Around Obstacles: Normal Steps: Normal (inferrred - had pt march in place x3 bilaterally without UE support, performed without difficulty) Total Score: 24       Pertinent Vitals/Pain Pain Assessment: No/denies pain    Home Living Family/patient expects to be discharged to:: Private residence Living Arrangements: Non-relatives/Friends Available Help at Discharge: Friend(s);Available PRN/intermittently Type of Home: Other(Comment) (hotel) Home Access: Level entry     Home Layout: One level Home Equipment: None      Prior Function Level of Independence: Independent               Hand Dominance   Dominant Hand: Right    Extremity/Trunk Assessment   Upper Extremity Assessment Upper Extremity Assessment: Overall WFL for tasks assessed    Lower Extremity Assessment Lower Extremity Assessment: Overall WFL for tasks assessed    Cervical / Trunk Assessment Cervical / Trunk Assessment: Normal  Communication   Communication: No difficulties  Cognition Arousal/Alertness: Awake/alert Behavior During Therapy: WFL for tasks assessed/performed Overall Cognitive Status: Within Functional Limits for tasks  assessed                                        General Comments      Exercises     Assessment/Plan    PT Assessment Patent does not need any further PT services  PT Problem List         PT Treatment Interventions      PT Goals (Current goals can be found in the Care Plan section)  Acute Rehab PT Goals Patient Stated Goal: go home today PT Goal  Formulation: With patient Time For Goal Achievement: 01/30/20 Potential to Achieve Goals: Good    Frequency     Barriers to discharge        Co-evaluation               AM-PAC PT "6 Clicks" Mobility  Outcome Measure Help needed turning from your back to your side while in a flat bed without using bedrails?: None Help needed moving from lying on your back to sitting on the side of a flat bed without using bedrails?: None Help needed moving to and from a bed to a chair (including a wheelchair)?: None Help needed standing up from a chair using your arms (e.g., wheelchair or bedside chair)?: None Help needed to walk in hospital room?: None Help needed climbing 3-5 steps with a railing? : None 6 Click Score: 24    End of Session   Activity Tolerance: Patient tolerated treatment well Patient left: in chair;with call bell/phone within reach Nurse Communication: Mobility status PT Visit Diagnosis: Other abnormalities of gait and mobility (R26.89)    Time: 7741-4239 PT Time Calculation (min) (ACUTE ONLY): 12 min   Charges:   PT Evaluation $PT Eval Low Complexity: 1 Low         Delpha Perko E, PT Acute Rehabilitation Services Pager [phone]  Office [phone]   Sharon Rubis D Hameed Kolar 01/30/2020, 10:09 AM

## 2020-01-30 NOTE — Progress Notes (Signed)
HD#1 Subjective:   Yesterday evening, night team paged due to patient going into atrial fibrillation with RVR. Patient was treated with IV fluids and Cardizem. This morning she returned to normal sinus rhythm with improved blood pressure. See resident's significant event note for more details. Later paged for 10/10 abdominal pain for which she was given a one time dose of toradol with relief.   Evaluated at bedside this morning during rounds. Patient states she is tolerating clears well. Feels significantly better compared to yesterday with no current nausea. Notes that she did not receive a shock from her defibrillator, last shock day before yesterday. She notes that she has had fragmented care due to transitioning her care as she just moved to area from [place]. Discussed with her our plan to keep her for antibiotic treatment while her blood cultures were pending, and she expressed understanding.   After rounds our team was notified by nursing patient wanted to leave AMA. By the time of our arrival at her room shortly thereafter she had already left.   Objective:   Vital signs in last 24 hours: Vitals:   01/30/20 0130 01/30/20 0145 01/30/20 0401 01/30/20 0738  BP: 109/81 110/79 110/76 106/76  Pulse: 96 100 91   Resp: (!) 21 20 (!) 21   Temp: 99.3 F (37.4 C) 99.3 F (37.4 C) 98.4 F (36.9 C) 97.8 F (36.6 C)  TempSrc:   Oral Oral  SpO2: 99% 99% 97%   Weight:      Height:       Physical Exam Gen: Well-appearing woman lying in bed, in no acute distress HEENT: moist mucous membranes CV: RRR, S1, S2 normal, No rubs, no murmurs, no gallops Pulm: CTAB, No rales, no wheezes Abd: Soft, BS+, no distension, no tenderness to palpation, no rebound, no guarding. Point-of-care ultrasound showed small amount of ascites not easily accessed for paracentesis Extm:  No peripheral edema Skin: Dry, Warm  Pertinent Labs: CBC Latest Ref Rng & Units 01/30/2020 01/29/2020 01/29/2020  WBC 4.0 -  10.5 K/uL 5.4 - 5.9  Hemoglobin 12.0 - 15.0 g/dL 11.5(L) 17.0(H) 14.4  Hematocrit 36 - 46 % 34.9(L) 50.0(H) 46.7(H)  Platelets 150 - 400 K/uL 87(L) - 131(L)    CMP Latest Ref Rng & Units 01/30/2020 01/29/2020 01/29/2020  Glucose 70 - 99 mg/dL 147(H) 59(L) -  BUN 6 - 20 mg/dL <5(L) 7 -  Creatinine 0.44 - 1.00 mg/dL 0.64 0.71 -  Sodium 135 - 145 mmol/L 135 135 129(L)  Potassium 3.5 - 5.1 mmol/L 3.1(L) 3.6 4.0  Chloride 98 - 111 mmol/L 106 106 -  CO2 22 - 32 mmol/L 21(L) 14(L) -  Calcium 8.9 - 10.3 mg/dL 8.1(L) 7.9(L) -  Total Protein 6.5 - 8.1 g/dL 5.1(L) 5.2(L) -  Total Bilirubin 0.3 - 1.2 mg/dL 2.1(H) 3.7(H) -  Alkaline Phos 38 - 126 U/L 83 86 -  AST 15 - 41 U/L 95(H) 156(H) -  ALT 0 - 44 U/L 64(H) 76(H) -     Assessment/Plan:   Active Problems:   Decompensated hepatic cirrhosis (HCC)   Patient Summary:  Rachel Fuller is a 39 year old woman w/ PMH ofalcoholic cardiomyopathy, systolic heart failure (EF 25-30%, now normalized to 60% on last echo) w/ ICD,alcoholic cirrhosis, esophageal varices, prior history of alcohol abuse who presented to the [facility] Emergency Department on 01/29/20 with 36-month history of intractable nausea and vomiting and found to be in hypovolemic shock.  Today is hospital day 0. As noted above,  shortly after rounds our team was notified by nursing patient wanted to leave AMA. By the time of our arrival at her room shortly thereafter she had already left.  Hypovolemic shock Hypothermia Hypoglycemia Patient had CBG in the 30s per EMS. On admission, BP 83/66 with temperature of 90.9. In the setting of 18-month of poor PO intake due to intractable nausea and vomiting. Initial presentation concerning for possible sepsis, however the patient did not have leukocytosis or obvious signs of infection. Given abdominal tenderness on exam she was initiated on ceftriaxone due for possible SBP in the setting of cirrhosis. Also received IVF. On exam this morning, nausea  and vomiting were significantly improved and abdominal exam non-tender. BP improved. POCUS showed a small amount of ascites not easily accessible for paracentesis. Blood cultures so far have come back 1 of 2 positive for coag negative staph suspicious for contaminant. Discussed plan to continue to monitor patient on antibiotics for 48 hours for blood culture results to which she was agreeable. As above, patient left AMA shortly after rounds. Nursing reported she had a phone call and reported a family emergency. Prior to leaving nursing provided her with return precautions.  - Patient s/p 2 g ceftriaxone on 01/29/20 - Will follow blood culture, though low suspicion for sepsis as the primary cause of her presenting symptoms - Will attempt to reach patient to encourage her to follow-up with primary care and GI in the [facility] system  Starvation ketoacidosis Hypokalemia Hypovolemic hyponatremia Electrolytes repleted as needed. Patient tolerating clears this morning. Had been planning to escalate diet. Patient is at high risk of refeeding syndrome.  Transaminitis Alcoholic Cirrhosis Improved with fluids, 243/11>156/76>95/64. - Will attempt to encourage patient to follow-up with [facility] GI  New Onset Atrial Fibrillation On admission noted to be in atrial fibrillation. On propranolol at home for varices. Likely driven by acute illness. Propranolol continued on admission. Not in RVR on admission however overnight went into RVR. BP did not tolerate IV metoprolol however BP improved with fluids. Later in the morning converted back to NSR.  Positive FOBT test Hx of esophageal varices Reported 1 episode of melena on the morning of admission. FOBT positive. Hemoglobin wnl at 14.4. No active GI bleed per EDP rectal exam - Would recommend follow-up CBC   Dispo: Patient left against medical advice shortly after rounds.   Please contact the on call pager after 5 pm and on weekends at  [phone].  Alexandria Lodge, MD PGY-1 Internal Medicine Teaching Service Pager: [phone] 01/30/2020

## 2020-01-30 NOTE — Discharge Summary (Signed)
Name: Rachel Fuller MRN: [id number] DOB: 11/17/1980 39 y.o. PCP: [facility]  Date of Admission: 01/29/2020  6:19 AM Date of Leaving Against Medical Advice: 01/30/2020 Attending Physician: Dr. Joni Reining  Discharge Diagnosis: 1. Hypovolemic shock 2. Hypothermia 3. Hypoglycemia 4. Starvation ketoacidosis 5. Hypokalemia 6. Hypovolemic hyponatremia 7. Alcoholic cirrhosis  Discharge Medications: Allergies as of 01/30/2020      Reactions   Bee Pollen Anaphylaxis   Clonidine Derivatives Shortness Of Breath, Rash   Gabapentin Rash   Pt felt weird   Ivp Dye [iodinated Diagnostic Agents] Anaphylaxis   Shellfish-derived Products Anaphylaxis   Doxycycline Hives   Naprosyn [naproxen] Nausea And Vomiting   Thelma Barge Officinalis] Swelling   Sulfa Antibiotics Other (See Comments)   Respiratory distress   Tylenol [acetaminophen]    Zofran [ondansetron] Nausea And Vomiting, Swelling   Motrin [ibuprofen] Nausea And Vomiting   Promethazine Rash      Medication List    ASK your doctor about these medications   nicotine 7 mg/24hr patch Commonly known as: NICODERM CQ - dosed in mg/24 hr Place 7 mg onto the skin daily.   ondansetron 8 MG disintegrating tablet Commonly known as: Zofran ODT Take 1 tablet (8 mg total) by mouth every 8 (eight) hours as needed for nausea or vomiting.   potassium chloride SA 20 MEQ tablet Commonly known as: KLOR-CON Take 1 tablet (20 mEq total) by mouth 2 (two) times daily.   prochlorperazine 10 MG tablet Commonly known as: COMPAZINE Take 1 tablet (10 mg total) by mouth every 6 (six) hours as needed for nausea or vomiting.   propranolol 20 MG tablet Commonly known as: INDERAL Take 20 mg by mouth 3 (three) times daily.       Disposition and follow-up:   [name] left against medical advice from [facility] in fair condition.    Follow-up Appointments:  Follow-up [place], [facility]. Schedule an appointment as soon as possible for a visit in 1 week(s).   Specialty: Internal Medicine Contact information: [place] [address] [phone]               Hospital Course by problem list:  Ms. Rachel Fuller is a 39 year old woman w/ PMH of alcoholic cardiomyopathy, systolic heart failure (EF 25-30%, now normalized to 60% on last echo) w/ ICD, alcoholic cirrhosis, esophageal varices, prior history of alcohol abuse who presented to the [facility] Emergency Department on 01/29/20 with 10-month history of intractable nausea and vomiting and found to be in hypovolemic shock.  After rounds our team was notified by nursing patient wanted to leave AMA. By the time of our arrival at her room shortly thereafter she had already left.  Hypovolemic shock Hypothermia Hypoglycemia Patient had CBG in the 30s per EMS. On admission, BP 83/66 with temperature of 90.9. In the setting of 58-month of poor PO intake due to intractable nausea and vomiting. Initial presentation concerning for possible sepsis, however the patient did not have leukocytosis or obvious signs of infection. Given abdominal tenderness on examshe was initiated on ceftriaxone due for possible SBP in the setting of cirrhosis. Also received IVF. On exam during rounds the next morning, nausea and vomiting were significantly improved and abdominal exam non-tender. BP improved. POCUS showed a small amount of ascites not easily accessible for paracentesis. Blood cultures so far came back 1 of 2 positive for coag negative staph suspicious for contaminant. Discussed plan to  continue to monitor patient on antibiotics for 48 hours for blood culture results to which she was agreeable. As above, patient left AMA shortly after rounds. Nursing reported she had a phone call and reported a family emergency. Prior to leaving nursing provided her with return precautions.  - Patient s/p 2 g ceftriaxone on  01/29/20 - Will follow blood culture, though low suspicion for sepsis as the primary cause of her presenting symptoms  Starvation ketoacidosis Hypokalemia Hypovolemic hyponatremia In the setting of poor oral intake, having 'barely eaten' for last 4 weeks. K 2.8 Na 129. Ketonuria Co2 15 with Gap of 19. Lactate 1.3 Delta-delta of 0.78 consistent with high + normal gap metabolic acidosis. Electrolytes were repleted as needed. Phos would have been monitored due to patient's high risk of refeeding syndrome.   Transaminitis Alcoholic Cirrhosis History of heavy alcohol use w/ admissions for delirium tremens. Known cirrhosis with sequelae including variceal hemorrhage and recurrent hepatic encephalopathy. MELD score of 22. Mentioned having been sober for 1 year. AST 243, ALT 100. Maddrey's Discriminant Function score of 19.9. No indication for steroid therapy. Improved with fluids, 243/11>156/76>95/64.  Chronic systolic heart failure Echo in 11/09/2019 with EF of 25-30%. Thought to be due to cardiac arrest during prior hospitalization and alcohol use. ICD placed by [facility]. Hypovolemic on admission. Not on ace inhibitor or aldosterone agonist. On propranolol for known varices. Antihypertensives were held in setting of hypotension.   New Onset Atrial Fibrillation On admission noted to be in atrial fibrillation. On propranolol at home for varices. Likely driven by acute illness. Propranolol continued on admission. Not in RVR on admission however overnight went into RVR. BP did not tolerate IV metoprolol however BP improved with fluids. Later in the morning converted back to NSR.  Positive FOBT test Hx of esophageal varices Reported 1 episode of melena on the morning of admission. FOBT positive.  Hemoglobin wnl at 14.4. No active GI bleed per EDP rectal exam. Plan had been to follow-up CBC.  Discharge Vitals:   BP 106/76 (BP Location: Right Arm)   Pulse 91   Temp 97.8 F (36.6 C) (Oral)   Resp  (!) 21   Ht 5\' 2"  (1.575 m)   Wt 49.9 kg   LMP 01/03/2020   SpO2 97%   BMI 20.12 kg/m   Pertinent Labs, Studies, and Procedures:   01/29/20 PORTABLE CHEST 1 VIEW  FINDINGS: Left chest wall pacing device with leads terminating over the right heart. Cardiomediastinal silhouette within normal limits. Both lungs are clear. No pneumothorax or pleural effusion. No acute osseous abnormality.  IMPRESSION: No focal airspace disease.  Electronically Signed   By: Primitivo Gauze M.D.   On: 01/29/2020 08:05  01/29/20 CT ABDOMEN AND PELVIS WITHOUT CONTRAST   FINDINGS: Lower chest: The visualized lung bases are clear bilaterally. Pacemaker leads are seen within the right heart. Global cardiac size within normal limits. No pericardial effusion.  Hepatobiliary: Severe hepatic steatosis. Heterogeneous attenuation of the liver parenchyma, particularly within segment 4, is not well assessed on this examination due to lack of contrast administration but may represent areas of geographic fatty sparing, fibrosis, or infiltrative disease. The liver contour is nodular and there is hypertrophy of the left hepatic and caudate lobes in keeping with cirrhosis. The main and intrahepatic portal veins are of normal contour on this noncontrast examination though patency is not well assessed in absence of contrast administration. No intra or extrahepatic biliary ductal dilation. Gallbladder unremarkable.  Pancreas: Unremarkable  Spleen: Unremarkable  Adrenals/Urinary Tract: Adrenal glands are unremarkable. Kidneys are normal, without renal calculi, focal lesion, or hydronephrosis. Bladder is unremarkable.  Stomach/Bowel: Stomach is within normal limits. Appendix appears normal. No evidence of bowel wall thickening, distention, or inflammatory changes. No free intraperitoneal gas or fluid.  Vascular/Lymphatic: Recanalization of the umbilical vein with superior epigastric and  periumbilical venous collateralization in keeping with portal venous hypertension. The abdominal vasculature is otherwise unremarkable on this noncontrast examination.  Reproductive: Uterus and bilateral adnexa are unremarkable.  Other: Rectum unremarkable  Musculoskeletal: No lytic or blastic bone lesions are seen.  IMPRESSION: No definite radiographic explanation of the patient's reported symptoms.  Severe fatty hepatic infiltration. Cirrhosis. Heterogeneous attenuation of the liver is not well assessed on this noncontrast examination with differential considerations listed above. Contrast enhanced MRI examination is recommended for further characterization once the patient's acute issues have resolved.   Electronically Signed   By: Fidela Salisbury MD   On: 01/29/2020 09:12   Signed: Alexandria Lodge, MD 01/30/2020, 4:42 PM   Pager: [phone]

## 2020-01-30 NOTE — Progress Notes (Signed)
PHARMACY - PHYSICIAN COMMUNICATION CRITICAL VALUE ALERT - BLOOD CULTURE IDENTIFICATION (BCID)  Rachel Fuller is an 39 y.o. female who presented to [facility] on 01/29/2020 with a chief complaint of abdominal pain w/ N/V/D x3wk.  Assessment:  Started on ABX for possible SBP, now w/ blood cx growing coag-negative staph in 1 of 2 bottles, likely contaminant, covered with Rocephin.  Name of physician (or Provider) ContactedPearlie Oyster MD  Current antibiotics: Rocephin  Changes to prescribed antibiotics recommended:  Patient is on recommended antibiotics - No changes needed  Results for orders placed or performed during the hospital encounter of 01/29/20  Blood Culture ID Panel (Reflexed) (Collected: 01/29/2020  7:29 AM)  Result Value Ref Range   Enterococcus faecalis NOT DETECTED NOT DETECTED   Enterococcus Faecium NOT DETECTED NOT DETECTED   Listeria monocytogenes NOT DETECTED NOT DETECTED   Staphylococcus species DETECTED (A) NOT DETECTED   Staphylococcus aureus (BCID) NOT DETECTED NOT DETECTED   Staphylococcus epidermidis NOT DETECTED NOT DETECTED   Staphylococcus lugdunensis NOT DETECTED NOT DETECTED   Streptococcus species NOT DETECTED NOT DETECTED   Streptococcus agalactiae NOT DETECTED NOT DETECTED   Streptococcus pneumoniae NOT DETECTED NOT DETECTED   Streptococcus pyogenes NOT DETECTED NOT DETECTED   A.calcoaceticus-baumannii NOT DETECTED NOT DETECTED   Bacteroides fragilis NOT DETECTED NOT DETECTED   Enterobacterales NOT DETECTED NOT DETECTED   Enterobacter cloacae complex NOT DETECTED NOT DETECTED   Escherichia coli NOT DETECTED NOT DETECTED   Klebsiella aerogenes NOT DETECTED NOT DETECTED   Klebsiella oxytoca NOT DETECTED NOT DETECTED   Klebsiella pneumoniae NOT DETECTED NOT DETECTED   Proteus species NOT DETECTED NOT DETECTED   Salmonella species NOT DETECTED NOT DETECTED   Serratia marcescens NOT DETECTED NOT DETECTED   Haemophilus influenzae NOT DETECTED NOT DETECTED    Neisseria meningitidis NOT DETECTED NOT DETECTED   Pseudomonas aeruginosa NOT DETECTED NOT DETECTED   Stenotrophomonas maltophilia NOT DETECTED NOT DETECTED   Candida albicans NOT DETECTED NOT DETECTED   Candida auris NOT DETECTED NOT DETECTED   Candida glabrata NOT DETECTED NOT DETECTED   Candida krusei NOT DETECTED NOT DETECTED   Candida parapsilosis NOT DETECTED NOT DETECTED   Candida tropicalis NOT DETECTED NOT DETECTED   Cryptococcus neoformans/gattii NOT DETECTED NOT DETECTED    Wynona Neat, PharmD, BCPS  01/30/2020  7:36 AM

## 2020-02-01 LAB — CULTURE, BLOOD (ROUTINE X 2): Special Requests: ADEQUATE

## 2020-02-26 ENCOUNTER — Encounter (HOSPITAL_COMMUNITY): Payer: Self-pay | Admitting: Emergency Medicine

## 2020-02-26 ENCOUNTER — Emergency Department (HOSPITAL_COMMUNITY)
Admission: EM | Admit: 2020-02-26 | Discharge: 2020-02-26 | Disposition: A | Discharge status: Home / Self Care | Payer: Medicare Other | Attending: Emergency Medicine | Admitting: Emergency Medicine

## 2020-02-26 ENCOUNTER — Emergency Department (HOSPITAL_COMMUNITY): Payer: Medicare Other

## 2020-02-26 ENCOUNTER — Other Ambulatory Visit: Payer: Self-pay

## 2020-02-26 DIAGNOSIS — R1013 Epigastric pain: Secondary | ICD-10-CM | POA: Diagnosis not present

## 2020-02-26 DIAGNOSIS — M546 Pain in thoracic spine: Secondary | ICD-10-CM | POA: Diagnosis not present

## 2020-02-26 DIAGNOSIS — Z5321 Procedure and treatment not carried out due to patient leaving prior to being seen by health care provider: Secondary | ICD-10-CM | POA: Insufficient documentation

## 2020-02-26 DIAGNOSIS — R0602 Shortness of breath: Secondary | ICD-10-CM | POA: Insufficient documentation

## 2020-02-26 DIAGNOSIS — R0789 Other chest pain: Secondary | ICD-10-CM | POA: Diagnosis present

## 2020-02-26 LAB — CBC
HCT: 38.7 % (ref 36.0–46.0)
Hemoglobin: 12.6 g/dL (ref 12.0–15.0)
MCH: 30.1 pg (ref 26.0–34.0)
MCHC: 32.6 g/dL (ref 30.0–36.0)
MCV: 92.4 fL (ref 80.0–100.0)
Platelets: 92 10*3/uL — ABNORMAL LOW (ref 150–400)
RBC: 4.19 MIL/uL (ref 3.87–5.11)
RDW: 22.2 % — ABNORMAL HIGH (ref 11.5–15.5)
WBC: 4.5 10*3/uL (ref 4.0–10.5)
nRBC: 0 % (ref 0.0–0.2)

## 2020-02-26 LAB — PROTIME-INR
INR: 1.3 — ABNORMAL HIGH (ref 0.8–1.2)
Prothrombin Time: 15.3 s — ABNORMAL HIGH (ref 11.4–15.2)

## 2020-02-26 LAB — I-STAT BETA HCG BLOOD, ED (MC, WL, AP ONLY): I-stat hCG, quantitative: <5 m[IU]/mL

## 2020-02-26 LAB — BASIC METABOLIC PANEL WITH GFR
Anion gap: 14 (ref 5–15)
BUN: <5 mg/dL — ABNORMAL LOW (ref 6–20)
CO2: 20 mmol/L — ABNORMAL LOW (ref 22–32)
Calcium: 9 mg/dL (ref 8.9–10.3)
Chloride: 103 mmol/L (ref 98–111)
Creatinine, Ser: 0.72 mg/dL (ref 0.44–1.00)
GFR, Estimated: >60 mL/min
Glucose, Bld: 92 mg/dL (ref 70–99)
Potassium: 3 mmol/L — ABNORMAL LOW (ref 3.5–5.1)
Sodium: 137 mmol/L (ref 135–145)

## 2020-02-26 LAB — TROPONIN I (HIGH SENSITIVITY): Troponin I (High Sensitivity): 5 ng/L

## 2020-02-26 NOTE — ED Triage Notes (Signed)
Patient arrived with EMS from home reports central chest/epigastric pain radiating to upper back onset this week with mild SOB , no emesis or diaphoresis , denies cough or fever.

## 2020-02-26 NOTE — ED Notes (Signed)
Pt said she felt better and left.

## 2020-02-27 ENCOUNTER — Emergency Department (HOSPITAL_COMMUNITY)
Admission: EM | Admit: 2020-02-27 | Discharge: 2020-02-27 | Disposition: A | Discharge status: Home / Self Care | Payer: Medicare Other | Attending: Emergency Medicine | Admitting: Emergency Medicine

## 2020-02-27 DIAGNOSIS — R1011 Right upper quadrant pain: Secondary | ICD-10-CM | POA: Diagnosis present

## 2020-02-27 DIAGNOSIS — R1115 Cyclical vomiting syndrome unrelated to migraine: Secondary | ICD-10-CM | POA: Diagnosis not present

## 2020-02-27 DIAGNOSIS — Z85038 Personal history of other malignant neoplasm of large intestine: Secondary | ICD-10-CM | POA: Diagnosis not present

## 2020-02-27 LAB — CBC WITH DIFFERENTIAL/PLATELET
Abs Immature Granulocytes: 0.04 10*3/uL (ref 0.00–0.07)
Basophils Absolute: 0.1 10*3/uL (ref 0.0–0.1)
Basophils Relative: 1 %
Eosinophils Absolute: 0.1 10*3/uL (ref 0.0–0.5)
Eosinophils Relative: 1 %
HCT: 40.8 % (ref 36.0–46.0)
Hemoglobin: 13.3 g/dL (ref 12.0–15.0)
Immature Granulocytes: 1 %
Lymphocytes Relative: 15 %
Lymphs Abs: 1.2 10*3/uL (ref 0.7–4.0)
MCH: 30.2 pg (ref 26.0–34.0)
MCHC: 32.6 g/dL (ref 30.0–36.0)
MCV: 92.7 fL (ref 80.0–100.0)
Monocytes Absolute: 0.7 10*3/uL (ref 0.1–1.0)
Monocytes Relative: 8 %
Neutro Abs: 6.2 10*3/uL (ref 1.7–7.7)
Neutrophils Relative %: 74 %
Platelets: 128 10*3/uL — ABNORMAL LOW (ref 150–400)
RBC: 4.4 MIL/uL (ref 3.87–5.11)
RDW: 22.1 % — ABNORMAL HIGH (ref 11.5–15.5)
WBC: 8.3 10*3/uL (ref 4.0–10.5)
nRBC: 0 % (ref 0.0–0.2)

## 2020-02-27 LAB — COMPREHENSIVE METABOLIC PANEL
ALT: 86 U/L — ABNORMAL HIGH (ref 0–44)
AST: 163 U/L — ABNORMAL HIGH (ref 15–41)
Albumin: 3.5 g/dL (ref 3.5–5.0)
Alkaline Phosphatase: 146 U/L — ABNORMAL HIGH (ref 38–126)
Anion gap: 11 (ref 5–15)
BUN: 6 mg/dL (ref 6–20)
CO2: 19 mmol/L — ABNORMAL LOW (ref 22–32)
Calcium: 8.3 mg/dL — ABNORMAL LOW (ref 8.9–10.3)
Chloride: 101 mmol/L (ref 98–111)
Creatinine, Ser: 0.63 mg/dL (ref 0.44–1.00)
GFR, Estimated: >60 mL/min (ref >60)
Glucose, Bld: 125 mg/dL — ABNORMAL HIGH (ref 70–99)
Potassium: 5.9 mmol/L — ABNORMAL HIGH (ref 3.5–5.1)
Sodium: 131 mmol/L — ABNORMAL LOW (ref 135–145)
Total Bilirubin: 4.7 mg/dL — ABNORMAL HIGH (ref 0.3–1.2)
Total Protein: 6.8 g/dL (ref 6.5–8.1)

## 2020-02-27 LAB — TROPONIN I (HIGH SENSITIVITY)
Troponin I (High Sensitivity): 5 ng/L (ref <18)
Troponin I (High Sensitivity): 4 ng/L (ref <18)

## 2020-02-27 LAB — LIPASE, BLOOD: Lipase: 34 U/L (ref 11–51)

## 2020-02-27 MED ORDER — SODIUM CHLORIDE 0.9 % IV BOLUS
1000.0000 mL | Freq: Once | INTRAVENOUS | Status: AC
  Administered 2020-02-27: 1000 mL via INTRAVENOUS

## 2020-02-27 MED ORDER — HALOPERIDOL LACTATE 5 MG/ML IJ SOLN
10.0000 mg | Freq: Once | INTRAMUSCULAR | Status: AC
  Administered 2020-02-27: 10 mg via INTRAVENOUS
  Filled 2020-02-27: qty 2

## 2020-02-27 MED ORDER — METOCLOPRAMIDE HCL 10 MG PO TABS: 10.0000 mg | ORAL_TABLET | Freq: Three times a day (TID) | ORAL | 0 refills | Status: DC | PRN

## 2020-02-27 MED ORDER — METOCLOPRAMIDE HCL 5 MG/ML IJ SOLN
10.0000 mg | Freq: Once | INTRAMUSCULAR | Status: AC
  Administered 2020-02-27: 10 mg via INTRAVENOUS
  Filled 2020-02-27: qty 2

## 2020-02-27 NOTE — Discharge Instructions (Signed)
Please avoid marijuana use as this is likely contributing to your chronic pain and nausea.

## 2020-02-27 NOTE — ED Provider Notes (Signed)
[facility] EMERGENCY DEPARTMENT Provider Note  CSN: [id number] Arrival date & time: 02/27/20 1724    History Chief Complaint  Patient presents with  . Chest Pain    HPI  Rachel Fuller is a 39 y.o. 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 She reports 4 days of vomiting and RUQ abdominal pain similar to numerous previous ED visits. She was here yesterday but left before being seen. She reports this afternoon, she felt her AICD fire and so she called EMS to bring her back to the ED. She denies any fever. Continues to smoke marijuana on a daily basis but does not drink alcohol any more.    Past Medical History:  Diagnosis Date  . Cancer ([place])   . Cirrhosis ([place])   . Colon cancer ([place])   . Polycystic kidney disease, congenital   . TBI (traumatic brain injury) [facility])     Past Surgical History:  Procedure Laterality Date  . ESOPHAGOGASTRODUODENOSCOPY W/ BANDING      No family history on file.  Social History   Tobacco Use  . Smoking status: Never Smoker  . Smokeless tobacco: Never Used  Substance Use Topics  . Alcohol use: Not Currently  . Drug use: Not Currently     Home Medications Prior to Admission medications   Medication Sig Start Date End Date Taking? Authorizing Provider  OMEPRAZOLE PO Take 1 capsule by mouth in the morning, at noon, in the evening, and at bedtime.   Yes [provider]  potassium chloride SA (KLOR-CON) 20 MEQ tablet Take 1 tablet (20 mEq total) by mouth 2 (two) times daily. 9/62/95  Yes Delora Fuel, MD  propranolol (INDERAL) 20 MG tablet Take 20 mg by mouth 3 (three) times daily. 12/25/19 12/24/20 Yes [provider]  metoCLOPramide (REGLAN) 10 MG tablet Take 1 tablet (10 mg total) by mouth every 8 (eight) hours as needed for nausea. 02/27/20   Truddie Hidden, MD   prochlorperazine (COMPAZINE) 10 MG tablet Take 1 tablet (10 mg total) by mouth every 6 (six) hours as needed for nausea or vomiting. Patient not taking: Reported on 01/29/2020 2/84/13 24/40/10  Delora Fuel, MD     Allergies    Bee pollen, Clonidine derivatives, Gabapentin, Ivp dye [iodinated diagnostic agents], Other, Shellfish-derived products, Doxycycline, Naprosyn [naproxen], Sage [salvia officinalis], Sulfa antibiotics, Tylenol [acetaminophen], Zofran [ondansetron], Motrin [ibuprofen], and Promethazine   Review of Systems   Review of Systems A comprehensive review of systems was completed and negative except as noted in HPI.    Physical Exam BP 113/78   Pulse 77   Temp 98.2 F (36.8 C) (Oral)   Resp (!) 21   Ht 5\' 2"  (1.575 m)   Wt 51 kg   SpO2 97%   BMI 20.56 kg/m   Physical Exam Vitals and nursing note reviewed.  Constitutional:      Appearance: Normal appearance.  HENT:     Head: Normocephalic and atraumatic.     Nose: Nose normal.     Mouth/Throat:     Mouth: Mucous membranes are moist.  Eyes:     Extraocular Movements: Extraocular movements intact.     Conjunctiva/sclera: Conjunctivae normal.  Cardiovascular:     Rate and Rhythm: Normal rate.  Pulmonary:     Effort: Pulmonary effort is normal.     Breath sounds: Normal breath sounds.  Abdominal:  General: Abdomen is flat.     Palpations: Abdomen is soft.     Tenderness: There is abdominal tenderness (RUQ). There is no guarding.  Musculoskeletal:        General: No swelling. Normal range of motion.     Cervical back: Neck supple.  Skin:    General: Skin is warm and dry.  Neurological:     General: No focal deficit present.     Mental Status: She is alert.  Psychiatric:        Mood and Affect: Mood normal.      ED Results / Procedures / Treatments   Labs (all labs ordered are listed, but only abnormal results are displayed) Labs Reviewed  COMPREHENSIVE METABOLIC PANEL - Abnormal; Notable  for the following components:      Result Value   Sodium 131 (*)    Potassium 5.9 (*)    CO2 19 (*)    Glucose, Bld 125 (*)    Calcium 8.3 (*)    AST 163 (*)    ALT 86 (*)    Alkaline Phosphatase 146 (*)    Total Bilirubin 4.7 (*)    All other components within normal limits  CBC WITH DIFFERENTIAL/PLATELET - Abnormal; Notable for the following components:   RDW 22.1 (*)    Platelets 128 (*)    All other components within normal limits  LIPASE, BLOOD  TROPONIN I (HIGH SENSITIVITY)  TROPONIN I (HIGH SENSITIVITY)    EKG EKG Interpretation  Date/Time:  Wednesday February 27 2020 17:33:59 EST Ventricular Rate:  86 PR Interval:    QRS Duration: 72 QT Interval:  423 QTC Calculation: 506 R Axis:   69 Text Interpretation: Sinus rhythm Probable left atrial enlargement Anteroseptal infarct, age indeterminate No significant change since last tracing Confirmed by Calvert Cantor [phone]) on 02/27/2020 5:35:51 PM   Radiology DG Chest 2 View  Result Date: 02/26/2020 CLINICAL DATA:  Chest pain EXAM: CHEST - 2 VIEW COMPARISON:  02/08/2020 FINDINGS: Dual-chamber ICD leads from the left. Normal heart size and mediastinal contours. There is no edema, consolidation, effusion, or pneumothorax. IMPRESSION: No evidence of active disease. Electronically Signed   By: Monte Fantasia M.D.   On: 02/26/2020 06:10    Procedures Procedures  Medications Ordered in the ED Medications  sodium chloride 0.9 % bolus 1,000 mL (0 mLs Intravenous Stopped 02/27/20 2242)  haloperidol lactate (HALDOL) injection 10 mg (10 mg Intravenous Given 02/27/20 2052)  metoCLOPramide (REGLAN) injection 10 mg (10 mg Intravenous Given 02/27/20 2240)     MDM Rules/Calculators/A&P MDM Patient with cyclic vomiting in setting of frequent THC use likely has a component of cannabinoid hyperemesis as well. Will interrogate her AICD due to report of firing today. Plan labs, IVF and haldol for symptom control.  ED Course  I  have reviewed the triage vital signs and the nursing notes.  Pertinent labs & imaging results that were available during my care of the patient were reviewed by me and considered in my medical decision making (see chart for details).  Clinical Course as of Feb 26 2254  Wed Feb 27, 2020  2000 Spoke with [employer] rep. No AICD firings since October.   [CS]  2034 CBC is normal. CMP shows mildly elevated K in setting of hemolysis, otherwise CMP and Lipase are at baseline.    [CS]  2034  Patient has had prolonged QT in the past but normal on EKG today. Given Haldol with some improvement but she still feels  nauseated. States she has had improvement with Reglan in the past.    [CS]  2252 Trop neg x 2, no concern for cardiac etiology of her symptoms today.    [CS]    Clinical Course User Index [CS] Truddie Hidden, MD    Final Clinical Impression(s) / ED Diagnoses Final diagnoses:  Cyclic vomiting syndrome    Rx / DC Orders ED Discharge Orders         Ordered    metoCLOPramide (REGLAN) 10 MG tablet  Every 8 hours PRN        02/27/20 2254           Truddie Hidden, MD 02/27/20 2255

## 2020-02-27 NOTE — ED Triage Notes (Signed)
Pt arrives [facility] from home for chest and epigastric pain x4 days. Pt believes her AICD went off today too. Pt came to ED yesterday but left triage d/t long wait. Pt has hx of cardiac arrest. Pt was given nitro by ems which dropped pts pressure and ems gave her fluids back to normotensive.

## 2020-02-27 NOTE — ED Notes (Signed)
[employer] called @ 1749-per Dr. Karle Starch called by Levada Dy

## 2020-02-27 NOTE — ED Notes (Signed)
Pt icd interrogated

## 2020-03-20 ENCOUNTER — Other Ambulatory Visit: Payer: Self-pay

## 2020-03-20 ENCOUNTER — Emergency Department (HOSPITAL_COMMUNITY)
Admission: EM | Admit: 2020-03-20 | Discharge: 2020-03-21 | Disposition: A | Discharge status: Home / Self Care | Payer: Medicare Other | Attending: Emergency Medicine | Admitting: Emergency Medicine

## 2020-03-20 DIAGNOSIS — R112 Nausea with vomiting, unspecified: Secondary | ICD-10-CM | POA: Insufficient documentation

## 2020-03-20 DIAGNOSIS — R1011 Right upper quadrant pain: Secondary | ICD-10-CM | POA: Diagnosis present

## 2020-03-20 DIAGNOSIS — Z5321 Procedure and treatment not carried out due to patient leaving prior to being seen by health care provider: Secondary | ICD-10-CM | POA: Insufficient documentation

## 2020-03-20 LAB — COMPREHENSIVE METABOLIC PANEL
ALT: 63 U/L — ABNORMAL HIGH (ref 0–44)
AST: 166 U/L — ABNORMAL HIGH (ref 15–41)
Albumin: 3.1 g/dL — ABNORMAL LOW (ref 3.5–5.0)
Alkaline Phosphatase: 242 U/L — ABNORMAL HIGH (ref 38–126)
Anion gap: 12 (ref 5–15)
BUN: 7 mg/dL (ref 6–20)
CO2: 21 mmol/L — ABNORMAL LOW (ref 22–32)
Calcium: 9.1 mg/dL (ref 8.9–10.3)
Chloride: 103 mmol/L (ref 98–111)
Creatinine, Ser: 0.64 mg/dL (ref 0.44–1.00)
GFR, Estimated: >60 mL/min (ref >60)
Glucose, Bld: 79 mg/dL (ref 70–99)
Potassium: 3.3 mmol/L — ABNORMAL LOW (ref 3.5–5.1)
Sodium: 136 mmol/L (ref 135–145)
Total Bilirubin: 15.4 mg/dL — ABNORMAL HIGH (ref 0.3–1.2)
Total Protein: 7.3 g/dL (ref 6.5–8.1)

## 2020-03-20 LAB — CBC WITH DIFFERENTIAL/PLATELET
Abs Immature Granulocytes: 0.02 10*3/uL (ref 0.00–0.07)
Basophils Absolute: 0 10*3/uL (ref 0.0–0.1)
Basophils Relative: 1 %
Eosinophils Absolute: 0.1 10*3/uL (ref 0.0–0.5)
Eosinophils Relative: 1 %
HCT: 37.2 % (ref 36.0–46.0)
Hemoglobin: 12.9 g/dL (ref 12.0–15.0)
Immature Granulocytes: 0 %
Lymphocytes Relative: 24 %
Lymphs Abs: 1.7 10*3/uL (ref 0.7–4.0)
MCH: 33.2 pg (ref 26.0–34.0)
MCHC: 34.7 g/dL (ref 30.0–36.0)
MCV: 95.6 fL (ref 80.0–100.0)
Monocytes Absolute: 0.5 10*3/uL (ref 0.1–1.0)
Monocytes Relative: 7 %
Neutro Abs: 4.7 10*3/uL (ref 1.7–7.7)
Neutrophils Relative %: 67 %
Platelets: 69 10*3/uL — ABNORMAL LOW (ref 150–400)
RBC: 3.89 MIL/uL (ref 3.87–5.11)
RDW: 20.6 % — ABNORMAL HIGH (ref 11.5–15.5)
WBC: 7 10*3/uL (ref 4.0–10.5)
nRBC: 0 % (ref 0.0–0.2)

## 2020-03-20 LAB — AMMONIA: Ammonia: 69 umol/L — ABNORMAL HIGH (ref 9–35)

## 2020-03-20 NOTE — ED Triage Notes (Signed)
PT said hx of stage 3 liver failure  with gastroparesis and pt said x 3 days she has been having RUQ pain with N/V and skin turning yellow/. Pt is loosing severe weight.Pt ios seeing a gastro dr but could not get in. 9/10 pain

## 2020-03-20 NOTE — ED Notes (Signed)
Patient called for vitals x1 with no answer.

## 2020-04-05 ENCOUNTER — Emergency Department (HOSPITAL_COMMUNITY): Payer: Medicare Other

## 2020-04-05 ENCOUNTER — Inpatient Hospital Stay (HOSPITAL_COMMUNITY)
Admission: EM | Admit: 2020-04-05 | Discharge: 2020-04-10 | DRG: 433 | Disposition: A | Discharge status: Home / Self Care | Payer: Medicare Other | Attending: Internal Medicine | Admitting: Internal Medicine

## 2020-04-05 ENCOUNTER — Encounter (HOSPITAL_COMMUNITY): Payer: Self-pay | Admitting: Pediatrics

## 2020-04-05 ENCOUNTER — Other Ambulatory Visit: Payer: Self-pay

## 2020-04-05 DIAGNOSIS — E876 Hypokalemia: Secondary | ICD-10-CM | POA: Diagnosis present

## 2020-04-05 DIAGNOSIS — F1020 Alcohol dependence, uncomplicated: Secondary | ICD-10-CM | POA: Diagnosis present

## 2020-04-05 DIAGNOSIS — R109 Unspecified abdominal pain: Secondary | ICD-10-CM | POA: Diagnosis not present

## 2020-04-05 DIAGNOSIS — Z7682 Awaiting organ transplant status: Secondary | ICD-10-CM

## 2020-04-05 DIAGNOSIS — Z85038 Personal history of other malignant neoplasm of large intestine: Secondary | ICD-10-CM

## 2020-04-05 DIAGNOSIS — K7460 Unspecified cirrhosis of liver: Secondary | ICD-10-CM | POA: Diagnosis present

## 2020-04-05 DIAGNOSIS — I959 Hypotension, unspecified: Secondary | ICD-10-CM | POA: Diagnosis present

## 2020-04-05 DIAGNOSIS — I5022 Chronic systolic (congestive) heart failure: Secondary | ICD-10-CM | POA: Diagnosis present

## 2020-04-05 DIAGNOSIS — K766 Portal hypertension: Secondary | ICD-10-CM | POA: Diagnosis present

## 2020-04-05 DIAGNOSIS — E869 Volume depletion, unspecified: Secondary | ICD-10-CM | POA: Diagnosis present

## 2020-04-05 DIAGNOSIS — K7210 Chronic hepatic failure without coma: Secondary | ICD-10-CM | POA: Diagnosis not present

## 2020-04-05 DIAGNOSIS — K7040 Alcoholic hepatic failure without coma: Secondary | ICD-10-CM | POA: Diagnosis not present

## 2020-04-05 DIAGNOSIS — K7290 Hepatic failure, unspecified without coma: Secondary | ICD-10-CM | POA: Diagnosis present

## 2020-04-05 DIAGNOSIS — I8510 Secondary esophageal varices without bleeding: Secondary | ICD-10-CM | POA: Diagnosis present

## 2020-04-05 DIAGNOSIS — Z681 Body mass index (BMI) 19 or less, adult: Secondary | ICD-10-CM

## 2020-04-05 DIAGNOSIS — R112 Nausea with vomiting, unspecified: Secondary | ICD-10-CM

## 2020-04-05 DIAGNOSIS — D6959 Other secondary thrombocytopenia: Secondary | ICD-10-CM | POA: Diagnosis present

## 2020-04-05 DIAGNOSIS — Z8616 Personal history of COVID-19: Secondary | ICD-10-CM

## 2020-04-05 DIAGNOSIS — F17200 Nicotine dependence, unspecified, uncomplicated: Secondary | ICD-10-CM | POA: Diagnosis present

## 2020-04-05 DIAGNOSIS — I9589 Other hypotension: Secondary | ICD-10-CM | POA: Diagnosis present

## 2020-04-05 DIAGNOSIS — I4891 Unspecified atrial fibrillation: Secondary | ICD-10-CM | POA: Diagnosis present

## 2020-04-05 DIAGNOSIS — D6489 Other specified anemias: Secondary | ICD-10-CM | POA: Diagnosis present

## 2020-04-05 DIAGNOSIS — Z5900 Homelessness unspecified: Secondary | ICD-10-CM

## 2020-04-05 DIAGNOSIS — Z79899 Other long term (current) drug therapy: Secondary | ICD-10-CM

## 2020-04-05 DIAGNOSIS — E440 Moderate protein-calorie malnutrition: Secondary | ICD-10-CM | POA: Diagnosis present

## 2020-04-05 DIAGNOSIS — K7031 Alcoholic cirrhosis of liver with ascites: Secondary | ICD-10-CM | POA: Diagnosis present

## 2020-04-05 DIAGNOSIS — R627 Adult failure to thrive: Secondary | ICD-10-CM | POA: Diagnosis present

## 2020-04-05 DIAGNOSIS — I426 Alcoholic cardiomyopathy: Secondary | ICD-10-CM | POA: Diagnosis present

## 2020-04-05 DIAGNOSIS — R188 Other ascites: Secondary | ICD-10-CM

## 2020-04-05 DIAGNOSIS — D689 Coagulation defect, unspecified: Secondary | ICD-10-CM | POA: Diagnosis present

## 2020-04-05 DIAGNOSIS — Z95810 Presence of automatic (implantable) cardiac defibrillator: Secondary | ICD-10-CM

## 2020-04-05 HISTORY — DX: Alcoholic cirrhosis of liver with ascites: K70.31

## 2020-04-05 LAB — GLUCOSE, PLEURAL OR PERITONEAL FLUID: Glucose, Fluid: 124 mg/dL

## 2020-04-05 LAB — URINALYSIS, ROUTINE W REFLEX MICROSCOPIC
Glucose, UA: NEGATIVE mg/dL
Hgb urine dipstick: NEGATIVE
Ketones, ur: NEGATIVE mg/dL
Leukocytes,Ua: NEGATIVE
Nitrite: NEGATIVE
Protein, ur: NEGATIVE mg/dL
Specific Gravity, Urine: 1.017 (ref 1.005–1.030)
pH: 6 (ref 5.0–8.0)

## 2020-04-05 LAB — COMPREHENSIVE METABOLIC PANEL
ALT: 32 U/L (ref 0–44)
AST: 103 U/L — ABNORMAL HIGH (ref 15–41)
Albumin: 2.1 g/dL — ABNORMAL LOW (ref 3.5–5.0)
Alkaline Phosphatase: 129 U/L — ABNORMAL HIGH (ref 38–126)
Anion gap: 13 (ref 5–15)
BUN: <5 mg/dL — ABNORMAL LOW (ref 6–20)
CO2: 19 mmol/L — ABNORMAL LOW (ref 22–32)
Calcium: 8.5 mg/dL — ABNORMAL LOW (ref 8.9–10.3)
Chloride: 102 mmol/L (ref 98–111)
Creatinine, Ser: 0.74 mg/dL (ref 0.44–1.00)
GFR, Estimated: >60 mL/min (ref >60)
Glucose, Bld: 115 mg/dL — ABNORMAL HIGH (ref 70–99)
Potassium: 3.7 mmol/L (ref 3.5–5.1)
Sodium: 134 mmol/L — ABNORMAL LOW (ref 135–145)
Total Bilirubin: 23.4 mg/dL (ref 0.3–1.2)
Total Protein: 6.4 g/dL — ABNORMAL LOW (ref 6.5–8.1)

## 2020-04-05 LAB — GRAM STAIN

## 2020-04-05 LAB — CBC WITH DIFFERENTIAL/PLATELET
Abs Immature Granulocytes: 0 10*3/uL (ref 0.00–0.07)
Basophils Absolute: 0 10*3/uL (ref 0.0–0.1)
Basophils Relative: 0 %
Eosinophils Absolute: 0.1 10*3/uL (ref 0.0–0.5)
Eosinophils Relative: 1 %
HCT: 33.9 % — ABNORMAL LOW (ref 36.0–46.0)
Hemoglobin: 11.6 g/dL — ABNORMAL LOW (ref 12.0–15.0)
Lymphocytes Relative: 7 %
Lymphs Abs: 0.7 10*3/uL (ref 0.7–4.0)
MCH: 33.6 pg (ref 26.0–34.0)
MCHC: 34.2 g/dL (ref 30.0–36.0)
MCV: 98.3 fL (ref 80.0–100.0)
Monocytes Absolute: 0.2 10*3/uL (ref 0.1–1.0)
Monocytes Relative: 2 %
Neutro Abs: 9 10*3/uL — ABNORMAL HIGH (ref 1.7–7.7)
Neutrophils Relative %: 90 %
Platelets: 132 10*3/uL — ABNORMAL LOW (ref 150–400)
RBC: 3.45 MIL/uL — ABNORMAL LOW (ref 3.87–5.11)
RDW: 21.3 % — ABNORMAL HIGH (ref 11.5–15.5)
WBC: 10 10*3/uL (ref 4.0–10.5)
nRBC: 0 % (ref 0.0–0.2)
nRBC: 1 /100 WBC — ABNORMAL HIGH

## 2020-04-05 LAB — BODY FLUID CELL COUNT WITH DIFFERENTIAL
Lymphs, Fluid: 11 %
Monocyte-Macrophage-Serous Fluid: 89 % (ref 50–90)
Neutrophil Count, Fluid: 0 % (ref 0–25)
Total Nucleated Cell Count, Fluid: 29 cu mm (ref 0–1000)

## 2020-04-05 LAB — AMMONIA: Ammonia: 70 umol/L — ABNORMAL HIGH (ref 9–35)

## 2020-04-05 LAB — LACTATE DEHYDROGENASE, PLEURAL OR PERITONEAL FLUID: LD, Fluid: 12 U/L (ref 3–23)

## 2020-04-05 LAB — RESP PANEL BY RT-PCR (FLU A&B, COVID) ARPGX2
Influenza A by PCR: NEGATIVE
Influenza B by PCR: NEGATIVE
SARS Coronavirus 2 by RT PCR: NEGATIVE

## 2020-04-05 LAB — PROTEIN, PLEURAL OR PERITONEAL FLUID: Total protein, fluid: <3 g/dL

## 2020-04-05 LAB — PROTIME-INR
INR: 1.7 — ABNORMAL HIGH (ref 0.8–1.2)
Prothrombin Time: 19 seconds — ABNORMAL HIGH (ref 11.4–15.2)

## 2020-04-05 LAB — I-STAT BETA HCG BLOOD, ED (MC, WL, AP ONLY): I-stat hCG, quantitative: <5 m[IU]/mL (ref <5)

## 2020-04-05 LAB — LIPASE, BLOOD: Lipase: 30 U/L (ref 11–51)

## 2020-04-05 MED ORDER — LORAZEPAM 2 MG/ML IJ SOLN
1.0000 mg | Freq: Once | INTRAMUSCULAR | Status: AC
  Administered 2020-04-05: 12:00:00 1 mg via INTRAVENOUS
  Filled 2020-04-05: qty 1

## 2020-04-05 MED ORDER — ONDANSETRON HCL 4 MG/2ML IJ SOLN
4.0000 mg | Freq: Four times a day (QID) | INTRAMUSCULAR | Status: DC | PRN
  Administered 2020-04-06 – 2020-04-10 (×7): 4 mg via INTRAVENOUS
  Filled 2020-04-05 (×7): qty 2

## 2020-04-05 MED ORDER — BISACODYL 5 MG PO TBEC: 5.0000 mg | DELAYED_RELEASE_TABLET | Freq: Every day | ORAL | Status: DC | PRN

## 2020-04-05 MED ORDER — FENTANYL CITRATE (PF) 100 MCG/2ML IJ SOLN
50.0000 ug | Freq: Once | INTRAMUSCULAR | Status: AC
Start: 2020-04-05 — End: 2020-04-05
  Administered 2020-04-05: 16:00:00 50 ug via INTRAVENOUS
  Filled 2020-04-05: qty 2

## 2020-04-05 MED ORDER — POLYETHYLENE GLYCOL 3350 17 G PO PACK: 17.0000 g | PACK | Freq: Every day | ORAL | Status: DC | PRN

## 2020-04-05 MED ORDER — MORPHINE SULFATE (PF) 2 MG/ML IV SOLN: 2.0000 mg | INTRAVENOUS | Status: DC | PRN

## 2020-04-05 MED ORDER — NICOTINE 7 MG/24HR TD PT24
7.0000 mg | MEDICATED_PATCH | Freq: Every day | TRANSDERMAL | Status: DC
  Administered 2020-04-06 – 2020-04-10 (×5): 7 mg via TRANSDERMAL
  Filled 2020-04-05 (×6): qty 1

## 2020-04-05 MED ORDER — ONDANSETRON HCL 4 MG/2ML IJ SOLN
4.0000 mg | Freq: Once | INTRAMUSCULAR | Status: AC
  Administered 2020-04-05: 08:00:00 4 mg via INTRAVENOUS
  Filled 2020-04-05: qty 2

## 2020-04-05 MED ORDER — DOCUSATE SODIUM 100 MG PO CAPS
100.0000 mg | ORAL_CAPSULE | Freq: Two times a day (BID) | ORAL | Status: DC
  Administered 2020-04-08 – 2020-04-10 (×4): 100 mg via ORAL
  Filled 2020-04-05 (×9): qty 1

## 2020-04-05 MED ORDER — FOLIC ACID 1 MG PO TABS
1.0000 mg | ORAL_TABLET | Freq: Every day | ORAL | Status: DC
  Administered 2020-04-05 – 2020-04-10 (×6): 1 mg via ORAL
  Filled 2020-04-05 (×6): qty 1

## 2020-04-05 MED ORDER — ALBUTEROL SULFATE (2.5 MG/3ML) 0.083% IN NEBU: 2.5000 mg | INHALATION_SOLUTION | RESPIRATORY_TRACT | Status: DC | PRN

## 2020-04-05 MED ORDER — LACTATED RINGERS IV SOLN: INTRAVENOUS | Status: DC

## 2020-04-05 MED ORDER — FENTANYL CITRATE (PF) 100 MCG/2ML IJ SOLN
50.0000 ug | Freq: Once | INTRAMUSCULAR | Status: AC
  Administered 2020-04-05: 09:00:00 50 ug via INTRAVENOUS
  Filled 2020-04-05: qty 2

## 2020-04-05 MED ORDER — OXYCODONE HCL 5 MG PO TABS
5.0000 mg | ORAL_TABLET | ORAL | Status: DC | PRN
  Administered 2020-04-05 – 2020-04-06 (×3): 5 mg via ORAL
  Filled 2020-04-05 (×3): qty 1

## 2020-04-05 MED ORDER — ONDANSETRON HCL 4 MG PO TABS
4.0000 mg | ORAL_TABLET | Freq: Four times a day (QID) | ORAL | Status: DC | PRN
  Filled 2020-04-05: qty 1

## 2020-04-05 MED ORDER — MELATONIN 3 MG PO TABS: 3.0000 mg | ORAL_TABLET | Freq: Every evening | ORAL | Status: DC | PRN

## 2020-04-05 MED ORDER — THIAMINE HCL 100 MG PO TABS
100.0000 mg | ORAL_TABLET | Freq: Every day | ORAL | Status: DC
  Administered 2020-04-05 – 2020-04-10 (×6): 100 mg via ORAL
  Filled 2020-04-05 (×6): qty 1

## 2020-04-05 MED ORDER — LIDOCAINE HCL (PF) 1 % IJ SOLN
INTRAMUSCULAR | Status: AC
  Filled 2020-04-05: qty 30

## 2020-04-05 MED ORDER — MIDODRINE HCL 5 MG PO TABS
10.0000 mg | ORAL_TABLET | Freq: Three times a day (TID) | ORAL | Status: DC
  Administered 2020-04-05 – 2020-04-10 (×14): 10 mg via ORAL
  Filled 2020-04-05 (×15): qty 2

## 2020-04-05 MED ORDER — ALBUMIN HUMAN 5 % IV SOLN
12.5000 g | Freq: Once | INTRAVENOUS | Status: AC
  Administered 2020-04-05: 11:00:00 12.5 g via INTRAVENOUS
  Filled 2020-04-05: qty 250

## 2020-04-05 MED ORDER — LACTATED RINGERS IV BOLUS
500.0000 mL | Freq: Once | INTRAVENOUS | Status: AC
  Administered 2020-04-05: 11:00:00 500 mL via INTRAVENOUS

## 2020-04-05 MED ORDER — ONDANSETRON HCL 4 MG/2ML IJ SOLN
4.0000 mg | Freq: Once | INTRAMUSCULAR | Status: AC
  Administered 2020-04-05: 11:00:00 4 mg via INTRAVENOUS
  Filled 2020-04-05: qty 2

## 2020-04-05 MED ORDER — POLYSACCHARIDE IRON COMPLEX 150 MG PO CAPS
150.0000 mg | ORAL_CAPSULE | ORAL | Status: DC
  Administered 2020-04-05 – 2020-04-09 (×4): 150 mg via ORAL
  Filled 2020-04-05 (×5): qty 1

## 2020-04-05 NOTE — Plan of Care (Signed)
  Problem: Education: Goal: Knowledge of General Education information will improve Description Including pain rating scale, medication(s)/side effects and non-pharmacologic comfort measures Outcome: Progressing   

## 2020-04-05 NOTE — H&P (Addendum)
History and Physical    Rachel Fuller [id number] DOB: [birth year] DOA: 04/05/2020  PCP: [place], [place] Consultants:  [place] - GI; [place] neurology; Osie Bond - nephrology; Doroth[place].Sergeant - cardiology Patient coming from: Homeless; NOK:   Chief Complaint: Abdominal pain  HPI: Rachel Fuller is a 39 y.o. 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  She was hospitalized at [facility] from 12/12-24, having been discharged yesterday.  She was hospitalized for hematemesis associated with esophageal variceal bleeding.  EGD on 12/16 was performed with banding; post-procedure she required boluses and pressors for hypotension.  She also had scant ascites drained with paracentesis on 12/21 (50 cc).  She was also COVID positive prior to admission to the MICU but remained asymptomatic and comfortable on RA throughout hospitalization.  After discharge, she went home and laid down and awoke about 5-6 hours later with terrible diffuse abdominal pain.  +N/V.  Last emesis was a couple of hours ago.  Her pain is mildly better after Ativan.  She is planning for a liver transplant - will be seen in [place] Jan 4.  She will have to find stable housing in order to receive a transplant and is working with the case manager to make this happen.  She was in [place] when the pain started and they recommended that she go to the nearest ER.   ED Course:  Discharged from [facility] yesterday. Has intractable n/v, nonbiluous.  No apparent SBP.  Pain gone after tap but still vomiting.  Bili 24.  [facility] and they did not see a need for transfer.  Basically homeless.   Review of Systems: As per HPI; otherwise review of systems reviewed and negative.   Ambulatory  Status:  Ambulates without assistance  COVID Vaccine Status:  Complete plus booster  Past Medical History:  Diagnosis Date  . Alcoholic cirrhosis of liver with ascites ([place])    also with esophageal varices and encephalopathy  . Colon cancer ([place]) 2019  . Polycystic kidney disease, congenital   . TBI (traumatic brain injury) [facility])     Past Surgical History:  Procedure Laterality Date  . ESOPHAGOGASTRODUODENOSCOPY W/ BANDING      Social History   Socioeconomic History  . Marital status: Unknown    Spouse name: Not on file  . Number of children: Not on file  . Years of education: Not on file  . Highest education level: Not on file  Occupational History  . Occupation: disabled  Tobacco Use  . Smoking status: Current Some Day Smoker  . Smokeless tobacco: Never Used  Substance and Sexual Activity  . Alcohol use: Not Currently    Comment: 12/12/2018 was her last drink  . Drug use: Yes    Types: Marijuana  . Sexual activity: Not on file  Other Topics Concern  . Not on file  Social History Narrative  . Not on file   Social Determinants of Health   Financial Resource Strain: Not on file  Food Insecurity: Not on file  Transportation Needs: Not on file  Physical Activity: Not on file  Stress: Not on file  Social Connections: Not on file  Intimate Partner Violence: Not on file    Allergies  Allergen Reactions  . Bee Pollen Anaphylaxis  . Clonidine Derivatives Shortness Of Breath and Rash  . Gabapentin Rash  and Other (See Comments)    Pt felt weird  . Ivp Dye [Iodinated Diagnostic Agents] Anaphylaxis  . Shellfish-Derived Products Anaphylaxis  . Doxycycline Hives  . Naprosyn [Naproxen] Nausea And Vomiting  . Thelma Barge Officinalis] Swelling  . Sulfa Antibiotics Other (See Comments)    Respiratory distress  . Tylenol [Acetaminophen] Other (See Comments)    Liver failure  . Motrin [Ibuprofen] Other (See Comments)    Liver failure  . Promethazine Rash    History  reviewed. No pertinent family history.  Prior to Admission medications   Medication Sig Start Date End Date Taking? Authorizing Provider  folic acid (FOLVITE) 1 MG tablet Take 1 mg by mouth daily.   Yes [provider]  iron polysaccharides (NIFEREX) 150 MG capsule Take 150 mg by mouth every other day.   Yes [provider]  melatonin 3 MG TABS tablet Take 3 mg by mouth at bedtime as needed (insomnia).   Yes [provider]  midodrine (PROAMATINE) 10 MG tablet Take 10 mg by mouth 3 (three) times daily after meals. 04/04/20  Yes [provider]  ondansetron (ZOFRAN-ODT) 4 MG disintegrating tablet Take 4 mg by mouth every 8 (eight) hours as needed for nausea or vomiting.   Yes [provider]  thiamine 100 MG tablet Take 100 mg by mouth daily.   Yes [provider]  prochlorperazine (COMPAZINE) 10 MG tablet Take 1 tablet (10 mg total) by mouth every 6 (six) hours as needed for nausea or vomiting. Patient not taking: Reported on 01/29/2020 [id number]  Delora Fuel, MD    Physical Exam: Vitals:   04/05/20 1500 04/05/20 1600 04/05/20 1715 04/05/20 1745  BP: 96/67 (!) 95/58 (!) 92/57 (!) 88/54  Pulse: 87 87  83  Resp: (!) 21 17 19 18   Temp: 98.5 F (36.9 C)  98.6 F (37 C) 98.2 F (36.8 C)  TempSrc:    Oral  SpO2: 98% 97% 96% 97%  Weight:    51.8 kg  Height:    5\' 1"  (1.549 m)     . General:  Appears calm and comfortable and is in NAD . Eyes:  PERRL, EOMI, normal lids, iris; +scleral icterus . ENT:  grossly normal hearing, lips & tongue, mmm . Neck:  no LAD, masses or thyromegaly . Cardiovascular:  RRR, no m/r/g.  . Respiratory:   CTA bilaterally with no wheezes/rales/rhonchi.  Normal respiratory effort. . Abdomen:  soft, NT, +ascites . Skin:  no rash or induration seen on limited exam; +jaundice . Musculoskeletal:  grossly normal tone BUE/BLE, good ROM, no bony abnormality . Psychiatric: blunted mood and affect, speech  fluent and appropriate, AOx3 . Neurologic:  CN 2-12 grossly intact, moves all extremities in coordinated fashion    Radiological Exams on Admission: Independently reviewed - see discussion in A/P where applicable  US Paracentesis  Result Date: 04/05/2020 INDICATION: Recurrent ascites secondary to alcoholic cirrhosis. Request for diagnostic and therapeutic paracentesis. EXAM: ULTRASOUND GUIDED PARACENTESIS MEDICATIONS: 1% lidocaine 10 mL COMPLICATIONS: None immediate. PROCEDURE: Informed written consent was obtained from the patient after a discussion of the risks, benefits and alternatives to treatment. A timeout was performed prior to the initiation of the procedure. Initial ultrasound scanning demonstrates a moderate amount of ascites within the left lateral abdomen quadrant. The left lateral abdomen was prepped and draped in the usual sterile fashion. 1% lidocaine was used for local anesthesia. Following this, a 19 gauge, 7-cm, Yueh catheter was introduced. An ultrasound image was saved  for documentation purposes. The paracentesis was performed. The catheter was removed and a dressing was applied. The patient tolerated the procedure well without immediate post procedural complication. FINDINGS: A total of approximately 2.5 L of clear yellow fluid was removed. Samples were sent to the laboratory as requested by the clinical team. IMPRESSION: Successful ultrasound-guided paracentesis yielding 2.5 liters of peritoneal fluid. Read by: Gareth Eagle, PA-C Electronically Signed   By: Markus Daft M.D.   On: 04/05/2020 10:59    EKG: Independently reviewed.  Sinus tachycardia with rate 102; low voltage with no evidence of acute ischemia   Labs on Admission: I have personally reviewed the available labs and imaging studies at the time of the admission.  Pertinent labs:   CO2 19 Glucose 115 AP 129 Albumin 2.1 AST 103/ALT 32/Bili 23.4 NH4 70 WBC 10.0 Hgb 11.6 INR 1.7 HCG <5 Ascites fluid: glucose  124, LCH 12, protein <3, 0 neutropils   Assessment/Plan Principal Problem:   End stage liver disease (HCC) Active Problems:   Decompensated hepatic cirrhosis (HCC)   Failure to thrive in adult   Hypotension, chronic   End stage liver disease -Abdominal pain, n/v appears to be related to ESLD - she has severe abdominal pain every few days and has seen GI for this issue -She was just discharged from [facility] yesterday with plan for outpatient hepatology f/u in [place] on 1/4 for pre-transplant evaluation -She does not appear to be a current transplant candidate due to homelessness/unstable living environment (and so presumed inconsistent ability to obtain meds, f/u) and will need this to be addressed prior to transplant -She also uses routine marijuana and periodic tobacco -58% of patient undergo hepatic decompensation within 10 years of diagnosis of cirrhosis, developing ascites/jaundice/encephalopathy/variceal bleeding; she already has all of these complications -Once cirrhosis is decompensated, average survival is 1.6 years -MELD score of 15 is the threshold for a patient survival with transplantation > survival without transplantation; patients should be considered for transplant referral in this circumstance. -MELD/MELD-Na score is 27, with a mortality rate of 19.6% -Child-Pugh category is C, with a 45% 1-year survival -Cirrhotic ascites progresses from: Portal HTN is present without known ascites -> uncomplicated ascites -> ascites + hyponatremia -> refractory ascites; this patient has uncomplicated ascites -She had 2.5L drained off today by IR, no apparent SBP -She has completed banding; patient is not a candidate for TIPS other than for salvage if life-threatening bleeding occurs -Patient reports alcohol cessation in 12/2018 -Recommend 2 gram sodium restricted diet with nutritional education; consult requested -She does not appear to be taking spironolactone and Lasix and would likely  benefit from addition of these medications; they were held during her last hospitalization due to relative hypotension. -I called to discuss transfer with [facility] in [place] - she has an upcoming appointment with them for outpatient liver transplant evaluation; since she does not have an obvious indication for transfer, she was declined and encouraged to f/u as outpatient as scheduled -Dr. Ralene Bathe also discussed transfer with [facility] and she was similarly declined -She does not appear to be a reasonable candidate for liver transplant at this time -Weekly albumin infusion in decompensated cirrhotics improves overall survival (ANSWER study) - would consider implementing  Failure to thrive -It appears that she will be difficult to discharge since she is failing outpatient f/u -Her most appropriate options currently appear to be SNF placement and palliative care -Will observe for now with [facility] team consult, advancement of diet -If able to tolerate PO, there  is little to require ongoing admission - but she is having FTT and so may end up requiring a LLOS due to uncertain disposition  Hypotension -Associated with 3rd spacing and intravascular volume depletion -Continue Midodrine  Alcoholic cardiomyopathy -AICD in place, had cardiac arrest on 0000000 -Prior systolic CHF with EF [id number] but this has recovered to 60% -Normal QTc at this time  Colon cancer -Reports in remission since 2019 -Interestingly, this was not really mentioned in her GI appointment in 11/2019 (establish care)  COVID-19 -Positive test on 12/13 but no symptoms during last hospitalization -She is vaccinated  -COVID negative today    Note: This patient has been tested and is negative for the novel coronavirus COVID-19. The patient has been fully vaccinated against COVID-19.    DVT prophylaxis: SCDs Code Status:  Full - confirmed with patient Family Communication: None present Disposition Plan:  The patient is from:  homeless  Anticipated d/c is to: be determined  Anticipated d/c date will depend on clinical response to treatment, but possibly as early as tomorrow if she has excellent response to treatment  Patient is currently: acutely ill Consults called:  Palliative care; TOC team; nutrition Admission status:  It is my clinical opinion that referral for OBSERVATION is reasonable and necessary in this patient based on the above information provided. The aforementioned taken together are felt to place the patient at high risk for further clinical deterioration. However it is anticipated that the patient may be medically stable for discharge from the hospital within 24 to 48 hours.    Karmen Bongo MD [facility]   How to contact the [facility] Attending or Consulting provider [place] or covering provider during after hours [place], for this patient?  1. Check the care team in [facility] and look for a) attending/consulting TRH provider listed and b) the [facility] team listed 2. Log into [email or web address] and use [place]'s universal password to access. If you do not have the password, please contact the hospital operator. 3. Locate the [facility] provider you are looking for under [facility] and page to a number that you can be directly reached. 4. If you still have difficulty reaching the provider, please page the [facility] (Director on Call) for the Hospitalists listed on amion for assistance.   04/05/2020, 5:52 PM

## 2020-04-05 NOTE — ED Notes (Signed)
Report received. Assumed care. Patient resting on stretcher in poc. Cardiac, bp, and pulse ox monitoring in place. Call bell in reach.  Patient ambulated to and from bathroom with assistance to provide urine specimen. Assisted back to bed. Monitoring replaced. Call bell in reach. Will continue to monitor.

## 2020-04-05 NOTE — ED Notes (Signed)
Patient currently in ultrasound.

## 2020-04-05 NOTE — Progress Notes (Signed)
New Admission Note:   Arrival Method: stretcher  Mental Orientation:  Alert and oriented  Telemetry: Assessment: Completed Skin: intact  IV: Right AC  Pain: 7/10  Tubes: none  Safety Measures: Safety Fall Prevention Plan has been given, discussed and signed Admission: Completed [facility] [place] Orientation: Patient has been orientated to the room, unit and staff.  Family: none   Orders have been reviewed and implemented. Will continue to monitor the patient. Call light has been placed within reach and bed alarm has been activated.   Manuel Lawhead RN [facility] Phone: [phone]

## 2020-04-05 NOTE — Procedures (Signed)
PROCEDURE SUMMARY:  Successful US guided paracentesis from left lateral abdomen.  Yielded 2.5 liters of clear yellow fluid.  No immediate complications.  Patient tolerated well.  EBL = trace  Specimen was sent for labs.  Millie Forde S Thailyn Khalid PA-C 04/05/2020 11:00 AM

## 2020-04-05 NOTE — ED Notes (Signed)
Admitting at bedside 

## 2020-04-05 NOTE — ED Triage Notes (Signed)
Arrived via EMS reported was in another facility [facility])  and was admitted under intensive care and was discharge yesterday afternoon. Hx of liver failure, gastroparesis and ascites. C/O of abdominal pain.

## 2020-04-05 NOTE — ED Provider Notes (Signed)
[facility] EMERGENCY DEPARTMENT Provider Note   CSN: [id number] Arrival date & time: 04/05/20  [id number]     History Chief Complaint  Patient presents with   Abdominal Pain    Rachel Fuller is a 39 y.o. female.  The history is provided by the patient and medical records.  Abdominal Pain  Rachel Fuller is a 39 y.o. female who presents to the Emergency Department complaining of pain and vomiting. She has a history of liver disease and was just discharged from [facility] yesterday following 21 day admission for variceal bleed that required intubation in the ICU. She was doing well at the time of hospital discharge. Late last night/early this morning she developed severe generalized abdominal pain with bilious emesis. She feels like her abdomen is full of fluid. No fevers. She does have watery stools, this is at her baseline. She reports dark urine and decreased urine output. She is currently being evaluated for liver transplant. Symptoms are severe, constant, worsening.    Past Medical History:  Diagnosis Date   Cancer ([place])    Cirrhosis ([place])    Colon cancer ([place])    Polycystic kidney disease, congenital    TBI (traumatic brain injury) [facility])     Patient Active Problem List   Diagnosis Date Noted   Decompensated hepatic cirrhosis ([place]) 01/29/2020    Past Surgical History:  Procedure Laterality Date   ESOPHAGOGASTRODUODENOSCOPY W/ BANDING       OB History   No obstetric history on file.     No family history on file.  Social History   Tobacco Use   Smoking status: Never Smoker   Smokeless tobacco: Never Used  Substance Use Topics   Alcohol use: Not Currently   Drug use: Not Currently    Home Medications Prior to Admission medications   Medication Sig Start Date End Date Taking? Authorizing Provider  folic acid (FOLVITE) 1 MG tablet Take 1 mg by mouth daily.   Yes [provider]  iron  polysaccharides (NIFEREX) 150 MG capsule Take 150 mg by mouth every other day.   Yes [provider]  melatonin 3 MG TABS tablet Take 3 mg by mouth at bedtime as needed (insomnia).   Yes [provider]  midodrine (PROAMATINE) 10 MG tablet Take 10 mg by mouth 3 (three) times daily after meals. 04/04/20  Yes [provider]  ondansetron (ZOFRAN-ODT) 4 MG disintegrating tablet Take 4 mg by mouth every 8 (eight) hours as needed for nausea or vomiting.   Yes [provider]  thiamine 100 MG tablet Take 100 mg by mouth daily.   Yes [provider]  prochlorperazine (COMPAZINE) 10 MG tablet Take 1 tablet (10 mg total) by mouth every 6 (six) hours as needed for nausea or vomiting. Patient not taking: Reported on 01/29/2020 [id number]  Delora Fuel, MD    Allergies    Bee pollen, Clonidine derivatives, Gabapentin, Ivp dye [iodinated diagnostic agents], Shellfish-derived products, Doxycycline, Naprosyn [naproxen], Sage [salvia officinalis], Sulfa antibiotics, Tylenol [acetaminophen], Motrin [ibuprofen], and Promethazine  Review of Systems   Review of Systems  Gastrointestinal: Positive for abdominal pain.  All other systems reviewed and are negative.   Physical Exam Updated Vital Signs BP (!) 89/62    Pulse (!) 101    Temp 98.8 F (37.1 C) (Oral)    Resp 14    Ht 5\' 1"  (1.549 m)    Wt 45.4 kg    SpO2 99%  BMI 18.89 kg/m   Physical Exam Vitals and nursing note reviewed.  Constitutional:      Appearance: She is well-developed and well-nourished.  HENT:     Head: Normocephalic and atraumatic.  Cardiovascular:     Rate and Rhythm: Regular rhythm. Tachycardia present.     Heart sounds: No murmur heard.   Pulmonary:     Effort: Pulmonary effort is normal. No respiratory distress.     Breath sounds: Normal breath sounds.  Abdominal:     Palpations: Abdomen is soft.     Tenderness: There is abdominal tenderness. There is no guarding or  rebound.     Comments: Moderate generalized abdominal tenderness  Musculoskeletal:        General: No swelling, tenderness or edema.  Skin:    General: Skin is warm and dry.     Coloration: Skin is jaundiced.  Neurological:     Mental Status: She is alert and oriented to person, place, and time.  Psychiatric:        Mood and Affect: Mood and affect normal.        Behavior: Behavior normal.     ED Results / Procedures / Treatments   Labs (all labs ordered are listed, but only abnormal results are displayed) Labs Reviewed  COMPREHENSIVE METABOLIC PANEL - Abnormal; Notable for the following components:      Result Value   Sodium 134 (*)    CO2 19 (*)    Glucose, Bld 115 (*)    BUN <5 (*)    Calcium 8.5 (*)    Total Protein 6.4 (*)    Albumin 2.1 (*)    AST 103 (*)    Alkaline Phosphatase 129 (*)    Total Bilirubin 23.4 (*)    All other components within normal limits  CBC WITH DIFFERENTIAL/PLATELET - Abnormal; Notable for the following components:   RBC 3.45 (*)    Hemoglobin 11.6 (*)    HCT 33.9 (*)    RDW 21.3 (*)    Platelets 132 (*)    Neutro Abs 9.0 (*)    nRBC 1 (*)    All other components within normal limits  PROTIME-INR - Abnormal; Notable for the following components:   Prothrombin Time 19.0 (*)    INR 1.7 (*)    All other components within normal limits  AMMONIA - Abnormal; Notable for the following components:   Ammonia 70 (*)    All other components within normal limits  BODY FLUID CELL COUNT WITH DIFFERENTIAL - Abnormal; Notable for the following components:   Color, Fluid YELLOW (*)    All other components within normal limits  GRAM STAIN  CULTURE, BODY FLUID-BOTTLE  RESP PANEL BY RT-PCR (FLU A&B, COVID) ARPGX2  LIPASE, BLOOD  LACTATE DEHYDROGENASE, PLEURAL OR PERITONEAL FLUID  PROTEIN, PLEURAL OR PERITONEAL FLUID  GLUCOSE, PLEURAL OR PERITONEAL FLUID  URINALYSIS, ROUTINE W REFLEX MICROSCOPIC  PATHOLOGIST SMEAR REVIEW  I-STAT BETA HCG BLOOD, ED  (MC, [facility], AP ONLY)    EKG EKG Interpretation  Date/Time:  Saturday April 05 2020 07:23:16 EST Ventricular Rate:  102 PR Interval:    QRS Duration: 66 QT Interval:  341 QTC Calculation: 445 R Axis:   75 Text Interpretation: Sinus tachycardia Low voltage, precordial leads Anteroseptal infarct, old Borderline repolarization abnormality Confirmed by Quintella Reichert [phone]) on 04/05/2020 7:32:38 AM   Radiology US Paracentesis  Result Date: 04/05/2020 INDICATION: Recurrent ascites secondary to alcoholic cirrhosis. Request for diagnostic and therapeutic paracentesis. EXAM: ULTRASOUND GUIDED  PARACENTESIS MEDICATIONS: 1% lidocaine 10 mL COMPLICATIONS: None immediate. PROCEDURE: Informed written consent was obtained from the patient after a discussion of the risks, benefits and alternatives to treatment. A timeout was performed prior to the initiation of the procedure. Initial ultrasound scanning demonstrates a moderate amount of ascites within the left lateral abdomen quadrant. The left lateral abdomen was prepped and draped in the usual sterile fashion. 1% lidocaine was used for local anesthesia. Following this, a 19 gauge, 7-cm, Yueh catheter was introduced. An ultrasound image was saved for documentation purposes. The paracentesis was performed. The catheter was removed and a dressing was applied. The patient tolerated the procedure well without immediate post procedural complication. FINDINGS: A total of approximately 2.5 L of clear yellow fluid was removed. Samples were sent to the laboratory as requested by the clinical team. IMPRESSION: Successful ultrasound-guided paracentesis yielding 2.5 liters of peritoneal fluid. Read by: Gareth Eagle, PA-C Electronically Signed   By: Markus Daft M.D.   On: 04/05/2020 10:59    Procedures Procedures (including critical care time)  Medications Ordered in ED Medications  lidocaine (PF) (XYLOCAINE) 1 % injection (has no administration in time range)   ondansetron (ZOFRAN) injection 4 mg (4 mg Intravenous Given 04/05/20 0759)  fentaNYL (SUBLIMAZE) injection 50 mcg (50 mcg Intravenous Given 04/05/20 0846)  lactated ringers bolus 500 mL (0 mLs Intravenous Stopped 04/05/20 1150)  albumin human 5 % solution 12.5 g (12.5 g Intravenous New Bag/Given 04/05/20 1051)  ondansetron (ZOFRAN) injection 4 mg (4 mg Intravenous Given 04/05/20 1128)  LORazepam (ATIVAN) injection 1 mg (1 mg Intravenous Given 04/05/20 1145)    ED Course  I have reviewed the triage vital signs and the nursing notes.  Pertinent labs & imaging results that were available during my care of the patient were reviewed by me and considered in my medical decision making (see chart for details).    MDM Rules/Calculators/A&P                         patient with history of liver disease secondary to alcohol abuse. Last drink was in September 2020. She was discharged yesterday from [facility] following admission for variceal bleed that required intubation. She represents today for abdominal bloating, abdominal pain and emesis since early this morning. She is not having any active bleeding. On presentation patient was significant tenderness, initial concern for possible SBP. Paracentesis was performed by IR, fluid is not consistent with SBP. Labs significant for rising bilirubin compared to yesterday, increased from 20 to 23.4. Hemoglobin is slightly improved since hospital discharge, up to 11. Ammonia is elevated at 70 but she is not currently encephalopathy. Given her complex medical history and especially care at [facility] consult was placed to their pal line. Discussed with care team at [facility], given only current major issue is uncontrolled emesis do not feel she requires transfer at this time. While patient's pain is improved with medication she is having ongoing emesis during her ED stay, do not feel that she is stable for  discharge home at this time. Hospitalist called for admission for ongoing symptom management.  Final Clinical Impression(s) / ED Diagnoses Final diagnoses:  Abdominal pain  Intractable vomiting with nausea, unspecified vomiting type  Chronic liver failure without hepatic coma [facility])    Rx / DC Orders ED Discharge Orders    None       Quintella Reichert, MD 04/05/20 1456

## 2020-04-06 DIAGNOSIS — I426 Alcoholic cardiomyopathy: Secondary | ICD-10-CM | POA: Diagnosis present

## 2020-04-06 DIAGNOSIS — R627 Adult failure to thrive: Secondary | ICD-10-CM | POA: Diagnosis present

## 2020-04-06 DIAGNOSIS — K7290 Hepatic failure, unspecified without coma: Secondary | ICD-10-CM | POA: Diagnosis not present

## 2020-04-06 DIAGNOSIS — K7031 Alcoholic cirrhosis of liver with ascites: Secondary | ICD-10-CM | POA: Diagnosis present

## 2020-04-06 DIAGNOSIS — Z5900 Homelessness unspecified: Secondary | ICD-10-CM | POA: Diagnosis not present

## 2020-04-06 DIAGNOSIS — I4891 Unspecified atrial fibrillation: Secondary | ICD-10-CM | POA: Diagnosis present

## 2020-04-06 DIAGNOSIS — E876 Hypokalemia: Secondary | ICD-10-CM | POA: Diagnosis present

## 2020-04-06 DIAGNOSIS — R1011 Right upper quadrant pain: Secondary | ICD-10-CM

## 2020-04-06 DIAGNOSIS — F1020 Alcohol dependence, uncomplicated: Secondary | ICD-10-CM | POA: Diagnosis present

## 2020-04-06 DIAGNOSIS — D689 Coagulation defect, unspecified: Secondary | ICD-10-CM | POA: Diagnosis present

## 2020-04-06 DIAGNOSIS — I5022 Chronic systolic (congestive) heart failure: Secondary | ICD-10-CM | POA: Diagnosis present

## 2020-04-06 DIAGNOSIS — I959 Hypotension, unspecified: Secondary | ICD-10-CM | POA: Diagnosis present

## 2020-04-06 DIAGNOSIS — Z7682 Awaiting organ transplant status: Secondary | ICD-10-CM | POA: Diagnosis not present

## 2020-04-06 DIAGNOSIS — E440 Moderate protein-calorie malnutrition: Secondary | ICD-10-CM | POA: Diagnosis present

## 2020-04-06 DIAGNOSIS — I8510 Secondary esophageal varices without bleeding: Secondary | ICD-10-CM | POA: Diagnosis present

## 2020-04-06 DIAGNOSIS — D6489 Other specified anemias: Secondary | ICD-10-CM | POA: Diagnosis present

## 2020-04-06 DIAGNOSIS — R109 Unspecified abdominal pain: Secondary | ICD-10-CM | POA: Diagnosis present

## 2020-04-06 DIAGNOSIS — Z515 Encounter for palliative care: Secondary | ICD-10-CM

## 2020-04-06 DIAGNOSIS — K7040 Alcoholic hepatic failure without coma: Secondary | ICD-10-CM | POA: Diagnosis present

## 2020-04-06 DIAGNOSIS — Z681 Body mass index (BMI) 19 or less, adult: Secondary | ICD-10-CM | POA: Diagnosis not present

## 2020-04-06 DIAGNOSIS — R188 Other ascites: Secondary | ICD-10-CM

## 2020-04-06 DIAGNOSIS — D6959 Other secondary thrombocytopenia: Secondary | ICD-10-CM | POA: Diagnosis present

## 2020-04-06 DIAGNOSIS — K7210 Chronic hepatic failure without coma: Secondary | ICD-10-CM | POA: Diagnosis not present

## 2020-04-06 DIAGNOSIS — Z85038 Personal history of other malignant neoplasm of large intestine: Secondary | ICD-10-CM | POA: Diagnosis not present

## 2020-04-06 DIAGNOSIS — K7460 Unspecified cirrhosis of liver: Secondary | ICD-10-CM

## 2020-04-06 DIAGNOSIS — Z79899 Other long term (current) drug therapy: Secondary | ICD-10-CM | POA: Diagnosis not present

## 2020-04-06 DIAGNOSIS — Z95810 Presence of automatic (implantable) cardiac defibrillator: Secondary | ICD-10-CM | POA: Diagnosis not present

## 2020-04-06 DIAGNOSIS — K766 Portal hypertension: Secondary | ICD-10-CM | POA: Diagnosis present

## 2020-04-06 DIAGNOSIS — Z8616 Personal history of COVID-19: Secondary | ICD-10-CM | POA: Diagnosis not present

## 2020-04-06 DIAGNOSIS — Z7189 Other specified counseling: Secondary | ICD-10-CM | POA: Diagnosis not present

## 2020-04-06 DIAGNOSIS — E869 Volume depletion, unspecified: Secondary | ICD-10-CM | POA: Diagnosis present

## 2020-04-06 DIAGNOSIS — F17200 Nicotine dependence, unspecified, uncomplicated: Secondary | ICD-10-CM | POA: Diagnosis present

## 2020-04-06 LAB — BASIC METABOLIC PANEL
Anion gap: 9 (ref 5–15)
BUN: <5 mg/dL — ABNORMAL LOW (ref 6–20)
CO2: 21 mmol/L — ABNORMAL LOW (ref 22–32)
Calcium: 8.1 mg/dL — ABNORMAL LOW (ref 8.9–10.3)
Chloride: 105 mmol/L (ref 98–111)
Creatinine, Ser: 0.7 mg/dL (ref 0.44–1.00)
GFR, Estimated: >60 mL/min (ref >60)
Glucose, Bld: 83 mg/dL (ref 70–99)
Potassium: 3.2 mmol/L — ABNORMAL LOW (ref 3.5–5.1)
Sodium: 135 mmol/L (ref 135–145)

## 2020-04-06 LAB — CBC
HCT: 30.4 % — ABNORMAL LOW (ref 36.0–46.0)
Hemoglobin: 10.7 g/dL — ABNORMAL LOW (ref 12.0–15.0)
MCH: 34.9 pg — ABNORMAL HIGH (ref 26.0–34.0)
MCHC: 35.2 g/dL (ref 30.0–36.0)
MCV: 99 fL (ref 80.0–100.0)
Platelets: 114 10*3/uL — ABNORMAL LOW (ref 150–400)
RBC: 3.07 MIL/uL — ABNORMAL LOW (ref 3.87–5.11)
RDW: 21.8 % — ABNORMAL HIGH (ref 11.5–15.5)
WBC: 8.3 10*3/uL (ref 4.0–10.5)
nRBC: 0 % (ref 0.0–0.2)

## 2020-04-06 LAB — HIV ANTIBODY (ROUTINE TESTING W REFLEX): HIV Screen 4th Generation wRfx: NONREACTIVE

## 2020-04-06 MED ORDER — PREDNISONE 50 MG PO TABS
50.0000 mg | ORAL_TABLET | Freq: Four times a day (QID) | ORAL | Status: AC
Start: 2020-04-06 — End: 2020-04-07
  Administered 2020-04-06 – 2020-04-07 (×3): 50 mg via ORAL
  Filled 2020-04-06 (×3): qty 1

## 2020-04-06 MED ORDER — PANTOPRAZOLE SODIUM 40 MG PO TBEC
40.0000 mg | DELAYED_RELEASE_TABLET | Freq: Every day | ORAL | Status: DC
  Administered 2020-04-06 – 2020-04-10 (×5): 40 mg via ORAL
  Filled 2020-04-06 (×5): qty 1

## 2020-04-06 MED ORDER — DIPHENHYDRAMINE HCL 50 MG/ML IJ SOLN
50.0000 mg | Freq: Once | INTRAMUSCULAR | Status: AC
  Administered 2020-04-07: 50 mg via INTRAVENOUS
  Filled 2020-04-06: qty 1

## 2020-04-06 MED ORDER — POTASSIUM CHLORIDE CRYS ER 20 MEQ PO TBCR
40.0000 meq | EXTENDED_RELEASE_TABLET | Freq: Once | ORAL | Status: AC
  Administered 2020-04-06: 40 meq via ORAL
  Filled 2020-04-06: qty 2

## 2020-04-06 MED ORDER — HYDROMORPHONE HCL 2 MG PO TABS
2.0000 mg | ORAL_TABLET | Freq: Three times a day (TID) | ORAL | Status: DC
  Administered 2020-04-06 – 2020-04-10 (×11): 2 mg via ORAL
  Filled 2020-04-06 (×11): qty 1

## 2020-04-06 MED ORDER — MORPHINE SULFATE (PF) 2 MG/ML IV SOLN
2.0000 mg | INTRAVENOUS | Status: DC | PRN
Start: 2020-04-06 — End: 2020-04-10

## 2020-04-06 MED ORDER — DIPHENHYDRAMINE HCL 25 MG PO CAPS: 50.0000 mg | ORAL_CAPSULE | Freq: Once | ORAL | Status: AC
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
## 2020-04-06 NOTE — Consult Note (Signed)
Referring Provider:  Dr. Kurtis Bushman, [facility] Primary Care Physician:  [place], [place] Primary Gastroenterologist:  Althia Forts, Dr. Marcelo Baldy at [facility]  Reason for Consultation:  Abdominal pain in the setting of cirrhosis  HPI: Rachel Fuller is a 39 y.o. 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  She was hospitalized at [facility] from 12/12-24.  She was hospitalized for hematemesis associated with esophageal variceal bleeding.  EGD on 12/16 was performed with banding; post-procedure she required boluses and pressors for hypotension.  She also had scant ascites drained with paracentesis on 12/21 (50 cc).  After discharge, she went home and laid down and awoke about 5-6 hours later with terrible diffuse abdominal pain and distention.  She is planning for a liver transplant - will be seen in [place] Jan 4.  She will have to find stable housing in order to receive a transplant and is working with the case manager to make this happen.  She was in [place] when the pain started and they recommended that she go to the nearest ER so she came to [facility].    Since she has been here she underwent paracentesis with 2.5 L of fluid removed, negative for SBP.  Labs look similar as to when she was discharged from [facility] 2 days ago.  EGD on 12/16 with two esophageal columns visualized and 2 bands in place.   Past Medical History:  Diagnosis Date  . Alcoholic cirrhosis of liver with ascites ([place])    also with esophageal varices and encephalopathy  . Colon cancer ([place]) 2019  . Polycystic kidney disease, congenital   . TBI (traumatic brain injury) [facility])     Past Surgical History:  Procedure Laterality Date  .  ESOPHAGOGASTRODUODENOSCOPY W/ BANDING      Prior to Admission medications   Medication Sig Start Date End Date Taking? Authorizing Provider  folic acid (FOLVITE) 1 MG tablet Take 1 mg by mouth daily.   Yes [provider]  iron polysaccharides (NIFEREX) 150 MG capsule Take 150 mg by mouth every other day.   Yes [provider]  melatonin 3 MG TABS tablet Take 3 mg by mouth at bedtime as needed (insomnia).   Yes [provider]  midodrine (PROAMATINE) 10 MG tablet Take 10 mg by mouth 3 (three) times daily after meals. 04/04/20  Yes [provider]  ondansetron (ZOFRAN-ODT) 4 MG disintegrating tablet Take 4 mg by mouth every 8 (eight) hours as needed for nausea or vomiting.   Yes [provider]  thiamine 100 MG tablet Take 100 mg by mouth daily.   Yes [provider]  prochlorperazine (COMPAZINE) 10 MG tablet Take 1 tablet (10 mg total) by mouth every 6 (six) hours as needed for nausea or vomiting. Patient not taking: Reported on 01/29/2020 07/18/12 48/18/56  Delora Fuel, MD    Current Facility-Administered Medications  Medication Dose Route Frequency Provider Last Rate Last Admin  . albuterol (PROVENTIL) (2.5 MG/3ML) 0.083% nebulizer solution 2.5 mg  2.5 mg Nebulization Q2H PRN Karmen Bongo, MD      . bisacodyl (DULCOLAX) EC tablet 5 mg  5 mg Oral Daily PRN Karmen Bongo, MD      . docusate sodium (COLACE) capsule 100 mg  100 mg Oral BID Karmen Bongo, MD      .  folic acid (FOLVITE) tablet 1 mg  1 mg Oral Daily Jonah Blue, MD   1 mg at 04/06/20 1735  . iron polysaccharides (NIFEREX) capsule 150 mg  150 mg Oral Heron Nay, MD   150 mg at 04/06/20 0916  . lactated ringers infusion   Intravenous Continuous Jonah Blue, MD 75 mL/hr at 04/06/20 0920 New Bag at 04/06/20 0920  . melatonin tablet 3 mg  3 mg Oral QHS PRN Jonah Blue, MD      . midodrine (PROAMATINE) tablet 10 mg  10 mg Oral TID Jackelyn Knife, MD    10 mg at 04/06/20 0917  . morphine 2 MG/ML injection 2 mg  2 mg Intravenous Q2H PRN Jonah Blue, MD      . nicotine (NICODERM CQ - dosed in mg/24 hr) patch 7 mg  7 mg Transdermal Daily Jonah Blue, MD   7 mg at 04/06/20 0917  . ondansetron (ZOFRAN) tablet 4 mg  4 mg Oral Q6H PRN Jonah Blue, MD       Or  . ondansetron [facility]) injection 4 mg  4 mg Intravenous Q6H PRN Jonah Blue, MD   4 mg at 04/06/20 0825  . oxyCODONE (Oxy IR/ROXICODONE) immediate release tablet 5 mg  5 mg Oral Q4H PRN Jonah Blue, MD   5 mg at 04/06/20 0915  . polyethylene glycol (MIRALAX / GLYCOLAX) packet 17 g  17 g Oral Daily PRN Jonah Blue, MD      . thiamine tablet 100 mg  100 mg Oral Daily Jonah Blue, MD   100 mg at 04/06/20 6701    Allergies as of 04/05/2020 - Review Complete 04/05/2020  Allergen Reaction Noted  . Bee pollen Anaphylaxis 05/18/2016  . Clonidine derivatives Shortness Of Breath and Rash 01/29/2020  . Gabapentin Rash and Other (See Comments) 12/24/2019  . Ivp dye [iodinated diagnostic agents] Anaphylaxis 12/24/2019  . Shellfish-derived products Anaphylaxis 07/16/2017  . Doxycycline Hives 12/24/2019  . Naprosyn [naproxen] Nausea And Vomiting 01/29/2020  . Sage [salvia officinalis] Swelling 01/29/2020  . Sulfa antibiotics Other (See Comments) 12/24/2019  . Tylenol [acetaminophen] Other (See Comments) 12/24/2019  . Motrin [ibuprofen] Other (See Comments) 12/24/2019  . Promethazine Rash 11/01/2019    History reviewed. No pertinent family history.  Social History   Socioeconomic History  . Marital status: Unknown    Spouse name: Not on file  . Number of children: Not on file  . Years of education: Not on file  . Highest education level: Not on file  Occupational History  . Occupation: disabled  Tobacco Use  . Smoking status: Current Some Day Smoker  . Smokeless tobacco: Never Used  Substance and Sexual Activity  . Alcohol use: Not Currently    Comment:  12/12/2018 was her last drink  . Drug use: Yes    Types: Marijuana  . Sexual activity: Not on file  Other Topics Concern  . Not on file  Social History Narrative  . Not on file   Social Determinants of Health   Financial Resource Strain: Not on file  Food Insecurity: Not on file  Transportation Needs: Not on file  Physical Activity: Not on file  Stress: Not on file  Social Connections: Not on file  Intimate Partner Violence: Not on file   Review of Systems: ROS is O/W negative except as mentioned in HPI.  Physical Exam: Vital signs in last 24 hours: Temp:  [98.2 F (36.8 C)-98.6 F (37 C)] 98.5 F (36.9 C) (12/26 0620) Pulse  Rate:  [83-101] 87 (12/26 0620) Resp:  [14-21] 18 (12/26 0620) BP: (88-96)/(54-67) 91/55 (12/26 0620) SpO2:  [96 %-99 %] 98 % (12/26 0620) FiO2 (%):  [21 %] 21 % (12/25 1625) Weight:  [51.8 kg] 51.8 kg (12/25 1745) Last BM Date: (P) 04/06/20 General:  Alert, chronically ill-appearing, pleasant and cooperative in NAD; jaundiced Head:  Normocephalic and atraumatic. Eyes:  Scleral icterus noted. Ears:  Normal auditory acuity. Mouth:  No deformity or lesions.   Lungs:  Clear throughout to auscultation.  No wheezes, crackles, or rhonchi.  Heart:  Regular rate and rhythm; no murmurs, clicks, rubs, or gallops. Abdomen:  Softly distended with ascites fluid.  BS present.  Mild diffuse TTP.  Rectal:  Deferred  Msk:  Symmetrical without gross deformities. Pulses:  Normal pulses noted. Extremities:  Without clubbing or edema. Neurologic:  Alert and oriented x 4;  grossly normal neurologically. Skin:  Intact without significant lesions or rashes.  Jaundice noted. Psych:  Alert and cooperative. Normal mood and affect.  Intake/Output from previous day: 12/25 0701 - 12/26 0700 In: 1174.4 [I.V.:1174.4] Out: 0   Lab Results: Recent Labs    04/05/20 0731 04/06/20 0645  WBC 10.0 8.3  HGB 11.6* 10.7*  HCT 33.9* 30.4*  PLT 132* 114*   BMET Recent Labs     04/05/20 0731 04/06/20 0645  NA 134* 135  K 3.7 3.2*  CL 102 105  CO2 19* 21*  GLUCOSE 115* 83  BUN <5* <5*  CREATININE 0.74 0.70  CALCIUM 8.5* 8.1*   LFT Recent Labs    04/05/20 0731  PROT 6.4*  ALBUMIN 2.1*  AST 103*  ALT 32  ALKPHOS 129*  BILITOT 23.4*   PT/INR Recent Labs    04/05/20 0731  LABPROT 19.0*  INR 1.7*   Studies/Results: US Paracentesis  Result Date: 04/05/2020 INDICATION: Recurrent ascites secondary to alcoholic cirrhosis. Request for diagnostic and therapeutic paracentesis. EXAM: ULTRASOUND GUIDED PARACENTESIS MEDICATIONS: 1% lidocaine 10 mL COMPLICATIONS: None immediate. PROCEDURE: Informed written consent was obtained from the patient after a discussion of the risks, benefits and alternatives to treatment. A timeout was performed prior to the initiation of the procedure. Initial ultrasound scanning demonstrates a moderate amount of ascites within the left lateral abdomen quadrant. The left lateral abdomen was prepped and draped in the usual sterile fashion. 1% lidocaine was used for local anesthesia. Following this, a 19 gauge, 7-cm, Yueh catheter was introduced. An ultrasound image was saved for documentation purposes. The paracentesis was performed. The catheter was removed and a dressing was applied. The patient tolerated the procedure well without immediate post procedural complication. FINDINGS: A total of approximately 2.5 L of clear yellow fluid was removed. Samples were sent to the laboratory as requested by the clinical team. IMPRESSION: Successful ultrasound-guided paracentesis yielding 2.5 liters of peritoneal fluid. Read by: Gareth Eagle, PA-C Electronically Signed   By: Markus Daft M.D.   On: 04/05/2020 10:59   IMPRESSION:  *Abdominal pain:  ? Just due to the ascites accumulation.  Fluid negative for SBP. *Cirrhosis:  Due to ETOH.  Being evaluated for transplant. *Ascites:  Paracentesis ordered and performed on 12/25 with 2.5 Liters of fluid  removed.  Negative for SBP. *Coagulopathy with INR 1.7 *Thrombocytopenia with platelets of 132 *History of HE:  Sounds like she takes her lactulose intermittently at home. *Hypotension:  On midodrine.  Diuretics have been on hold because of this.  PLAN: -Not sure what else to do acutely other than to possible do  a CT scan of the abdomen and pelvis. -Monitor labs.   Laban Emperor. Jajuan Skoog  04/06/2020, 1:09 PM

## 2020-04-06 NOTE — Progress Notes (Signed)
PROGRESS NOTE    Rachel Fuller  [id number] DOB: [birth year] DOA: 04/05/2020 PCP: [facility]    Brief Narrative:  Rachel Fuller is a 39 y.o. 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     Consultants:   GI  Procedures:   Antimicrobials:       Subjective: Pt with abdominal pain, less than before since had paracentesis yesterday. No n/v  Objective: Vitals:   04/05/20 1715 04/05/20 1745 04/05/20 2028 04/06/20 0620  BP: (!) 92/57 (!) 88/54 (!) 88/59 (!) 91/55  Pulse:  83 84 87  Resp: 19 18 20 18   Temp: 98.6 F (37 C) 98.2 F (36.8 C) 98.6 F (37 C) 98.5 F (36.9 C)  TempSrc:  Oral Oral Oral  SpO2: 96% 97% 97% 98%  Weight:  51.8 kg    Height:  5\' 1"  (1.549 m)      Intake/Output Summary (Last 24 hours) at 04/06/2020 0818 Last data filed at 04/06/2020 0400 Gross per 24 hour  Intake 1174.38 ml  Output 0 ml  Net 1174.38 ml   Filed Weights   04/05/20 0719 04/05/20 1745  Weight: 45.4 kg 51.8 kg    Examination:  General exam: Appears calm and comfortable , jaundice Respiratory system: Clear to auscultation. Respiratory effort normal. Cardiovascular system: S1 & S2 heard, RRR. No JVD, murmurs, rubs, gallops or clicks. No pedal edema. Gastrointestinal system: distended, mildly ttp diffusely, +bs Central nervous system: Alert and oriented. No focal neurological deficits. Extremities: no edema, scd in place Psychiatry: Judgement and insight appear normal. Mood & affect appropriate.     Data Reviewed: I have personally reviewed following labs and imaging studies  CBC: Recent Labs  Lab 04/05/20 0731 04/06/20 0645  WBC 10.0 8.3  NEUTROABS 9.0*  --   HGB 11.6* 10.7*  HCT 33.9* 30.4*  MCV 98.3 99.0   PLT 132* PENDING   Basic Metabolic Panel: Recent Labs  Lab 04/05/20 0731 04/06/20 0645  NA 134* 135  K 3.7 3.2*  CL 102 105  CO2 19* 21*  GLUCOSE 115* 83  BUN <5* <5*  CREATININE 0.74 0.70  CALCIUM 8.5* 8.1*   GFR: Estimated Creatinine Clearance: 69.8 mL/min (by C-G formula based on SCr of 0.7 mg/dL). Liver Function Tests: Recent Labs  Lab 04/05/20 0731  AST 103*  ALT 32  ALKPHOS 129*  BILITOT 23.4*  PROT 6.4*  ALBUMIN 2.1*   Recent Labs  Lab 04/05/20 0731  LIPASE 30   Recent Labs  Lab 04/05/20 0731  AMMONIA 70*   Coagulation Profile: Recent Labs  Lab 04/05/20 0731  INR 1.7*   Cardiac Enzymes: No results for input(s): CKTOTAL, CKMB, CKMBINDEX, TROPONINI in the last 168 hours. BNP (last 3 results) No results for input(s): PROBNP in the last 8760 hours. HbA1C: No results for input(s): HGBA1C in the last 72 hours. CBG: No results for input(s): GLUCAP in the last 168 hours. Lipid Profile: No results for input(s): CHOL, HDL, LDLCALC, TRIG, CHOLHDL, LDLDIRECT in the last 72 hours. Thyroid Function Tests: No results for input(s): TSH, T4TOTAL, FREET4, T3FREE, THYROIDAB in the last 72 hours. Anemia Panel: No results for input(s): VITAMINB12, FOLATE, FERRITIN, TIBC, IRON, RETICCTPCT in the last 72 hours. Sepsis Labs: No results for input(s): PROCALCITON, LATICACIDVEN in the last 168 hours.  Recent Results (from the past  240 hour(s))  Gram stain     Status: None   Collection Time: 04/05/20 10:36 AM   Specimen: Peritoneal Washings  Result Value Ref Range Status   Specimen Description PERITONEAL  Final   Special Requests FLUID  Final   Gram Stain   Final    WBC PRESENT, PREDOMINANTLY MONONUCLEAR NO ORGANISMS SEEN CYTOSPIN SMEAR Performed at [facility], [address], [place], [place] [address]    Report Status 04/05/2020 FINAL  Final  Resp Panel by RT-PCR (Flu A&B, Covid) Nasopharyngeal Swab     Status: None   Collection Time: 04/05/20  2:11  PM   Specimen: Nasopharyngeal Swab; Nasopharyngeal(NP) swabs in vial transport medium  Result Value Ref Range Status   SARS Coronavirus 2 by RT PCR NEGATIVE NEGATIVE Final    Comment: (NOTE) SARS-CoV-2 target nucleic acids are NOT DETECTED.  The SARS-CoV-2 RNA is generally detectable in upper respiratory specimens during the acute phase of infection. The lowest concentration of SARS-CoV-2 viral copies this assay can detect is 138 copies/mL. A negative result does not preclude SARS-Cov-2 infection and should not be used as the sole basis for treatment or other patient management decisions. A negative result may occur with  improper specimen collection/handling, submission of specimen other than nasopharyngeal swab, presence of viral mutation(s) within the areas targeted by this assay, and inadequate number of viral copies(<138 copies/mL). A negative result must be combined with clinical observations, patient history, and epidemiological information. The expected result is Negative.  Fact Sheet for Patients:  EntrepreneurPulse.com.au  Fact Sheet for Healthcare Providers:  IncredibleEmployment.be  This test is no t yet approved or cleared by the [place] FDA and  has been authorized for detection and/or diagnosis of SARS-CoV-2 by FDA under an Emergency Use Authorization (EUA). This EUA will remain  in effect (meaning this test can be used) for the duration of the COVID-19 declaration under Section 564(b)(1) of the Act, 21 U.S.C.section 360bbb-3(b)(1), unless the authorization is terminated  or revoked sooner.       Influenza A by PCR NEGATIVE NEGATIVE Final   Influenza B by PCR NEGATIVE NEGATIVE Final    Comment: (NOTE) The Xpert Xpress SARS-CoV-2/FLU/RSV plus assay is intended as an aid in the diagnosis of influenza from Nasopharyngeal swab specimens and should not be used as a sole basis for treatment. Nasal washings and aspirates are  unacceptable for Xpert Xpress SARS-CoV-2/FLU/RSV testing.  Fact Sheet for Patients: EntrepreneurPulse.com.au  Fact Sheet for Healthcare Providers: IncredibleEmployment.be  This test is not yet approved or cleared by the [place] FDA and has been authorized for detection and/or diagnosis of SARS-CoV-2 by FDA under an Emergency Use Authorization (EUA). This EUA will remain in effect (meaning this test can be used) for the duration of the COVID-19 declaration under Section 564(b)(1) of the Act, 21 U.S.C. section 360bbb-3(b)(1), unless the authorization is terminated or revoked.  Performed at [facility], [place] [address] [place], [place] [address]          Radiology Studies: US Paracentesis  Result Date: 04/05/2020 INDICATION: Recurrent ascites secondary to alcoholic cirrhosis. Request for diagnostic and therapeutic paracentesis. EXAM: ULTRASOUND GUIDED PARACENTESIS MEDICATIONS: 1% lidocaine 10 mL COMPLICATIONS: None immediate. PROCEDURE: Informed written consent was obtained from the patient after a discussion of the risks, benefits and alternatives to treatment. A timeout was performed prior to the initiation of the procedure. Initial ultrasound scanning demonstrates a moderate amount of ascites within the left lateral abdomen quadrant. The left  lateral abdomen was prepped and draped in the usual sterile fashion. 1% lidocaine was used for local anesthesia. Following this, a 19 gauge, 7-cm, Yueh catheter was introduced. An ultrasound image was saved for documentation purposes. The paracentesis was performed. The catheter was removed and a dressing was applied. The patient tolerated the procedure well without immediate post procedural complication. FINDINGS: A total of approximately 2.5 L of clear yellow fluid was removed. Samples were sent to the laboratory as requested by the clinical team. IMPRESSION: Successful ultrasound-guided  paracentesis yielding 2.5 liters of peritoneal fluid. Read by: Gareth Eagle, PA-C Electronically Signed   By: Markus Daft M.D.   On: 04/05/2020 10:59        Scheduled Meds: . docusate sodium  100 mg Oral BID  . folic acid  1 mg Oral Daily  . iron polysaccharides  150 mg Oral QODAY  . midodrine  10 mg Oral TID PC  . nicotine  7 mg Transdermal Daily  . potassium chloride  40 mEq Oral Once  . thiamine  100 mg Oral Daily   Continuous Infusions: . lactated ringers 75 mL/hr at 04/05/20 1821    Assessment & Plan:   Principal Problem:   End stage liver disease ([place]) Active Problems:   Decompensated hepatic cirrhosis ([facility])   Failure to thrive in adult   Hypotension, chronic   End stage liver disease -Abdominal pain, n/v appears to be related to ESLD - she has severe abdominal pain every few days and has seen GI for this issue -She was just discharged from [facility] 12/24 with plan for outpatient hepatology f/u in [place] on 1/4 for pre-transplant evaluation -She does not appear to be a current transplant candidate due to homelessness/unstable living environment (and so presumed inconsistent ability to obtain meds, f/u) and will need this to be addressed prior to transplant -She also uses routine marijuana and periodic tobacco -She has completed banding; patient is not a candidate for TIPS other than for salvage if life-threatening bleeding occurs -Patient reports alcohol cessation in 12/2018 12/26-status post paracentesis 2.5 L of clear yellow fluid on 12/25 with some improvement of her symptoms but still with abdominal pain -GI was consulted today, will f/u -[facility].   Failure to thrive -It appears that she will be difficult to discharge since she is failing outpatient f/u -Her most appropriate options currently appear to be SNF placement and palliative care -Will observe for now with [facility] team consult, advancement of diet 12/26-encouraged po  intake. I/o Nutrition consult   Hypotension -Associated with 3rd spacing and intravascular volume depletion -continue Midodrine  Alcoholic cardiomyopathy -AICD in place, had cardiac arrest on 0000000 -Prior systolic CHF with EF [id number] but this has recovered to 60% -Normal QTc at this time  Colon cancer -Reports in remission since 2019 -Interestingly, this was not really mentioned in her GI appointment in 11/2019 (establish care)  COVID-19 -Positive test on 12/13 but no symptoms during last hospitalization -She is vaccinated  -COVID negative    Note: This patient has been tested and is negative for the novel coronavirus COVID-19. The patient has been fully vaccinated against COVID-19.     DVT prophylaxis: scd Code Status:full Family Communication: none at bedside  Status is: Observation  The patient will require care spanning > 2 midnights and should be moved to inpatient because: Inpatient level of care appropriate due to severity of illness  Dispo: The patient is from: Home  Anticipated d/c is to: Home              Anticipated d/c date is: 2 days              Patient currently is not medically stable to d/c.            LOS: 0 days   Time spent: 35 minutes with more than 50% on [place], MD [facility] Pager [phone]  If 7PM-7AM, please contact night-coverage 04/06/2020, 8:18 AM

## 2020-04-07 ENCOUNTER — Encounter (HOSPITAL_COMMUNITY): Payer: Self-pay | Admitting: Internal Medicine

## 2020-04-07 ENCOUNTER — Inpatient Hospital Stay (HOSPITAL_COMMUNITY): Payer: Medicare Other

## 2020-04-07 DIAGNOSIS — Z7189 Other specified counseling: Secondary | ICD-10-CM | POA: Diagnosis not present

## 2020-04-07 DIAGNOSIS — R1011 Right upper quadrant pain: Secondary | ICD-10-CM | POA: Diagnosis not present

## 2020-04-07 DIAGNOSIS — K7210 Chronic hepatic failure without coma: Secondary | ICD-10-CM | POA: Diagnosis not present

## 2020-04-07 DIAGNOSIS — Z515 Encounter for palliative care: Secondary | ICD-10-CM | POA: Diagnosis not present

## 2020-04-07 LAB — GLUCOSE, CAPILLARY: Glucose-Capillary: 80 mg/dL (ref 70–99)

## 2020-04-07 LAB — PATHOLOGIST SMEAR REVIEW

## 2020-04-07 MED ORDER — PROSOURCE PLUS PO LIQD
30.0000 mL | Freq: Two times a day (BID) | ORAL | Status: DC
  Administered 2020-04-08 – 2020-04-10 (×5): 30 mL via ORAL
  Filled 2020-04-07 (×5): qty 30

## 2020-04-07 MED ORDER — FUROSEMIDE 20 MG PO TABS
20.0000 mg | ORAL_TABLET | Freq: Every day | ORAL | Status: DC
Start: 2020-04-07 — End: 2020-04-10
  Administered 2020-04-07 – 2020-04-10 (×4): 20 mg via ORAL
  Filled 2020-04-07 (×4): qty 1

## 2020-04-07 MED ORDER — POTASSIUM CHLORIDE CRYS ER 20 MEQ PO TBCR
40.0000 meq | EXTENDED_RELEASE_TABLET | Freq: Once | ORAL | Status: AC
  Administered 2020-04-07: 40 meq via ORAL
  Filled 2020-04-07: qty 2

## 2020-04-07 MED ORDER — SPIRONOLACTONE 25 MG PO TABS
50.0000 mg | ORAL_TABLET | Freq: Every day | ORAL | Status: DC
Start: 2020-04-07 — End: 2020-04-10
  Administered 2020-04-07 – 2020-04-10 (×4): 50 mg via ORAL
  Filled 2020-04-07 (×4): qty 2

## 2020-04-07 MED ORDER — ADULT MULTIVITAMIN W/MINERALS CH
1.0000 | ORAL_TABLET | Freq: Every day | ORAL | Status: DC
Start: 2020-04-07 — End: 2020-04-10
  Administered 2020-04-08 – 2020-04-10 (×4): 1 via ORAL
  Filled 2020-04-07 (×4): qty 1

## 2020-04-07 MED ORDER — IOHEXOL 300 MG/ML  SOLN
100.0000 mL | Freq: Once | INTRAMUSCULAR | Status: AC | PRN
  Administered 2020-04-07: 100 mL via INTRAVENOUS

## 2020-04-07 NOTE — Progress Notes (Signed)
Initial Nutrition Assessment  DOCUMENTATION CODES:   Non-severe (moderate) malnutrition in context of chronic illness  INTERVENTION:   85ml Prosource Plus po BID, each supplement provides 100 kcals and 15 grams of protein  Carnation Instant Breakfast po TID with meals  MVI with minerals daily  If PO intake does not improve, recommend initiation of enteral nutrition. 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    GOAL:   Patient will meet greater than or equal to 90% of their needs    MONITOR:   PO intake,Labs,Weight trends,I & O's  REASON FOR ASSESSMENT:   Malnutrition Screening Tool,Consult Other (Comment) ("nutritional goals")  ASSESSMENT:   Pt admitted with N/V and abdominal pain likely 2/2 ESLD. 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  Pt had EGD on 12/16 with two esophageal columns visualized and 2 bands in place.   12/25 - s/p paracentesis, 2.5L removed, negative for SBP  Per Palliative Care documentation, pt is currently homeless.   Pt reports poor appetite since 2019. Pt states that she attempts to eat several times throughout the day, but experiences early satiety and then vomits. Pt attributes this to EtOH consumption. Per pt, she has been experiencing postprandial emesis  every day since August with an associated 40 lbs wt loss. Unable to verify this  wt loss as pt noted to have weighed 52.6 kg in August 2021 per Care Everywhere and now weighs 51.8 kg.  Discussed nutrition interventions with pt. Pt reports that she has tried Ensure, Boost, and [employer] during previous admissions and does not like/tolerate these supplements. Pt expressed interest in nutrition support. Will trial additional oral nutrition supplements and if po intake does not improve, recommend initiation of enteral nutrition.   No UOP documented. Emesis: 1x unmeasured occurrence x24 hours  Labs: K+ 3.2 (L) Medications: colace, folvite, lasix, niferex, aldactone, thiamine    NUTRITION - FOCUSED PHYSICAL EXAM:  Flowsheet Row Most Recent Value  Orbital Region Mild depletion  Upper Arm Region Moderate depletion  Thoracic and Lumbar Region Moderate depletion  Buccal Region Moderate depletion  Temple Region Moderate depletion  Clavicle Bone Region Moderate depletion  Clavicle and Acromion Bone Region Moderate depletion  Scapular Bone Region Moderate depletion  Dorsal Hand Severe depletion  Patellar Region Severe depletion  Anterior Thigh Region Severe depletion  Posterior Calf Region Moderate depletion  Edema (RD Assessment) None  Hair Reviewed  Eyes Reviewed  Mouth Reviewed  Skin Reviewed  Nails Reviewed       Diet Order:   Diet Order            Diet 2 gram sodium Room service appropriate? Yes; Fluid consistency: Thin  Diet effective now                 EDUCATION NEEDS:   No education needs have been identified at this time  Skin:  Skin Assessment: Reviewed RN Assessment  Last BM:  12/27 type 4  Height:  Ht Readings from Last 1 Encounters:  04/05/20 5\' 1"  (1.549 m)    Weight:   Wt Readings from Last 1 Encounters:  04/05/20 51.8 kg   BMI:  Body mass index is 21.58 kg/m.  Estimated Nutritional Needs:   Kcal:  1550-1750  Protein:  75-85 grams  Fluid:  >1.55L/d    Larkin Ina, MS, RD, LDN RD pager number and weekend/on-call  pager number located in [place].

## 2020-04-07 NOTE — Progress Notes (Addendum)
Daily Rounding Note  04/07/2020, 2:17 PM  LOS: 1 day   SUBJECTIVE:   Chief complaint:  Cirrhosis of liver.   Aminal pain  Abdominal pain was better after the paracentesis 2 days ago.  It starting to come back.  It is in the left lower quadrant which is where she has been tapped both at [facility] as well as here at [facility].  She is having some ascitic leaking from both of the tap sites. In the past she required every 2 week paracentesis but then was put on Lasix/Aldactone and had not been requiring paracentesis.  She herself is not sure why they stopped the Lasix Aldactone, notes in the chart say it was because of hypotension which makes sense given that she is recovered quiring midodrine. Goes into detail about how she needs to take her meds at certain times of the day.  She has had chronic nausea so she takes her Zofran first thing in the morning and then later takes her midodrine.    OBJECTIVE:         Vital signs in last 24 hours:    Temp:  [97.7 F (36.5 C)-97.8 F (36.6 C)] 97.7 F (36.5 C) (12/27 0530) Pulse Rate:  [78-89] 78 (12/27 0530) Resp:  [14-16] 14 (12/27 0530) BP: (85-96)/(36-65) 85/58 (12/27 0530) SpO2:  [95 %-99 %] 95 % (12/27 0530) Last BM Date: 04/07/20 Filed Weights   04/05/20 0719 04/05/20 1745  Weight: 45.4 kg 51.8 kg   General: Thin, somewhat sarcopenic appearing Heart: RRR Chest: Clear.  No labored breathing Abdomen: Soft, slight protuberance/distention.  When she standing her left abdomen rides slightly lower than her right abdomen.  Tender in the left lower quadrant but not guarding or rebound Extremities: No CCE Neuro/Psych: Fluid speech.  Fully alert and oriented.  No tremor.  No asterixis  Intake/Output from previous day: 12/26 0701 - 12/27 0700 In: 1931.5 [I.V.:1931.5] Out: 1 [Emesis/NG output:1]  Intake/Output this shift: No intake/output data  recorded.  Lab Results: Recent Labs    04/05/20 0731 04/06/20 0645  WBC 10.0 8.3  HGB 11.6* 10.7*  HCT 33.9* 30.4*  PLT 132* 114*   BMET Recent Labs    04/05/20 0731 04/06/20 0645  NA 134* 135  K 3.7 3.2*  CL 102 105  CO2 19* 21*  GLUCOSE 115* 83  BUN <5* <5*  CREATININE 0.74 0.70  CALCIUM 8.5* 8.1*   LFT Recent Labs    04/05/20 0731  PROT 6.4*  ALBUMIN 2.1*  AST 103*  ALT 32  ALKPHOS 129*  BILITOT 23.4*   PT/INR Recent Labs    04/05/20 0731  LABPROT 19.0*  INR 1.7*   Hepatitis Panel No results for input(s): HEPBSAG, HCVAB, HEPAIGM, HEPBIGM in the last 72 hours.  Studies/Results: CT ABDOMEN PELVIS W CONTRAST  Result Date: 04/07/2020 CLINICAL DATA:  Diffuse abdominal pain for several days. EXAM: CT ABDOMEN AND PELVIS WITH CONTRAST TECHNIQUE: Multidetector CT imaging of the abdomen and pelvis was performed using the standard protocol following bolus administration of intravenous contrast. CONTRAST:  177mL OMNIPAQUE IOHEXOL 300 MG/ML  SOLN COMPARISON:  01/27/2018 FINDINGS: Lower chest: Trace bilateral pleural effusions with overlying areas of mild platelike atelectasis. Hepatobiliary: Diffuse hepatic steatosis with morphologic changes of cirrhosis. There is a suspicion of a focal low-attenuation structure within the inferior right hepatic lobe measuring 3.1 cm, image 26/3 and image 73/6. gallbladder wall appears diffusely edematous, likely reflecting portal  venous hypertension. No biliary ductal dilatation. Pancreas: Unremarkable. No pancreatic ductal dilatation or surrounding inflammatory changes. Spleen: Measures 8.6 x 5.2 by 11.2 cm (volume = 260 cm^3) Adrenals/Urinary Tract: Normal adrenal glands. No kidney mass or hydronephrosis identified. Urinary bladder is unremarkable. Stomach/Bowel: Stomach is nondistended. No bowel wall thickening, inflammation or distension. Vascular/Lymphatic: Normal appearance of the abdominal aorta. No aneurysm. Portal vein appears  increased in caliber measuring 1.6 cm. Upper abdominal varices including recanalization of the umbilical vein. No abdominopelvic adenopathy. Reproductive: Uterus and bilateral adnexa are unremarkable. Other: Large volume of ascites is identified within the abdomen and pelvis. This is new when compared with 01/27/2018. Musculoskeletal: No acute or significant osseous findings. IMPRESSION: 1. Morphologic features of the liver compatible with cirrhosis. Stigmata of portal venous hypertension is noted including large volume of ascites and upper abdominal varices. The spleen has a normal size. 2. There is a suspicion of a focal low-attenuation structure within the inferior right hepatic lobe measuring 3.1 cm. When the patient is clinically stable and able to follow directions and hold their breath (preferably as an outpatient) further evaluation with dedicated abdominal MRI should be considered. 3. Trace bilateral pleural effusions. Electronically Signed   By: Kerby Moors M.D.   On: 04/07/2020 11:49   Scheduled Meds: . docusate sodium  100 mg Oral BID  . folic acid  1 mg Oral Daily  . HYDROmorphone  2 mg Oral Q8H  . iron polysaccharides  150 mg Oral QODAY  . midodrine  10 mg Oral TID PC  . nicotine  7 mg Transdermal Daily  . pantoprazole  40 mg Oral Q0600  . thiamine  100 mg Oral Daily   Continuous Infusions: . lactated ringers 75 mL/hr at 04/06/20 2325   PRN Meds:.albuterol, bisacodyl, melatonin, morphine injection, ondansetron **OR** ondansetron (ZOFRAN) IV, polyethylene glycol  ASSESMENT:    *    Abdominal pain.  CTAP w contrast confirms diagnosis of cirrhosis, normal-sized spleen, stigmata of portal hypertension, large volume ascites, upper abdominal varices.  Low attenuation 3.1 cm structure in the inferior right hepatic lobe.  Could be worked up further with MRI. AFP 3.3 on 04/2018.     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  *    Variceal bleed hospitalized at [facility]  12/12 -04/04/2020.  Hematemesis addressed with EGD 12/16, esophageal varices banded. Has appointment set up with hepatology, transplant team at [facility] on 1/4.  *    Ascites.  2.5 L paracentesis, negative for SBP. Previous diuretics discontinued due to hypotension.  *   Hx hepatic encephalopathy.  *    Homeless.      PLAN   *   Follow-up with her GI doctor Cornellla at [facility].  ?  Defer MRI to [facility]. Patient may be at a point where she is going to require periodic paracentesis which can be arranged by Dr. Marcelo Baldy, GI at [facility].      Azucena Freed  04/07/2020, 2:17 PM Phone [phone]    Attending physician's note   I have taken an interval history, reviewed the chart and examined the patient. I agree with the Advanced Practitioner's note, impression and recommendations.   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  Under care of Dr. Gertie Fey. Pt has been in touch with him.  As per his advice -Recheck MELD in AM.  If significantly  worse, transfer to [facility]. Of note-they did not have bed earlier.  If better, can D/C. -Low-dose diuretics (she could not tolerate higher dose d/t hypotension) -Stop IV fluids. -Low-salt diet.   Carmell Austria, MD [facility] [phone]

## 2020-04-07 NOTE — Progress Notes (Addendum)
PROGRESS NOTE    Rachel Fuller  [id number] DOB: [birth year] DOA: 04/05/2020 PCP: [facility]    Brief Narrative:  Rachel Fuller is a 39 y.o. 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   12/27-getting CT scan today. Needs to be premedicated  Consultants:   GI  Procedures:   Antimicrobials:       Subjective: Reports abdominal pain is better.  No nausea or vomiting  Objective: Vitals:   04/06/20 1449 04/06/20 1537 04/06/20 2123 04/07/20 0530  BP: (!) 87/58 96/65 (!) 95/36 (!) 85/58  Pulse: 89  81 78  Resp:   16 14  Temp:   97.8 F (36.6 C) 97.7 F (36.5 C)  TempSrc:   Oral Oral  SpO2: 98%  99% 95%  Weight:      Height:        Intake/Output Summary (Last 24 hours) at 04/07/2020 0842 Last data filed at 04/07/2020 0601 Gross per 24 hour  Intake 1931.53 ml  Output 1 ml  Net 1930.53 ml   Filed Weights   04/05/20 0719 04/05/20 1745  Weight: 45.4 kg 51.8 kg    Examination: Calm, comfortable CTA no wheeze rales rhonchi's Regular S1-S2 Distended, soft, nontender to palpation positive bowel sounds No edema Alert oriented x3, grossly intact    Data Reviewed: I have personally reviewed following labs and imaging studies  CBC: Recent Labs  Lab 04/05/20 0731 04/06/20 0645  WBC 10.0 8.3  NEUTROABS 9.0*  --   HGB 11.6* 10.7*  HCT 33.9* 30.4*  MCV 98.3 99.0  PLT 132* [id number]*   Basic Metabolic Panel: Recent Labs  Lab 04/05/20 0731 04/06/20 0645  NA 134* 135  K 3.7 3.2*  CL 102 105  CO2 19* 21*  GLUCOSE 115* 83  BUN <5* <5*  CREATININE 0.74 0.70  CALCIUM 8.5* 8.1*   GFR: Estimated Creatinine Clearance: 69.8 mL/min (by C-G formula based on SCr of 0.7 mg/dL). Liver Function Tests: Recent  Labs  Lab 04/05/20 0731  AST 103*  ALT 32  ALKPHOS 129*  BILITOT 23.4*  PROT 6.4*  ALBUMIN 2.1*   Recent Labs  Lab 04/05/20 0731  LIPASE 30   Recent Labs  Lab 04/05/20 0731  AMMONIA 70*   Coagulation Profile: Recent Labs  Lab 04/05/20 0731  INR 1.7*   Cardiac Enzymes: No results for input(s): CKTOTAL, CKMB, CKMBINDEX, TROPONINI in the last 168 hours. BNP (last 3 results) No results for input(s): PROBNP in the last 8760 hours. HbA1C: No results for input(s): HGBA1C in the last 72 hours. CBG: Recent Labs  Lab 04/05/20 1749  GLUCAP 80   Lipid Profile: No results for input(s): CHOL, HDL, LDLCALC, TRIG, CHOLHDL, LDLDIRECT in the last 72 hours. Thyroid Function Tests: No results for input(s): TSH, T4TOTAL, FREET4, T3FREE, THYROIDAB in the last 72 hours. Anemia Panel: No results for input(s): VITAMINB12, FOLATE, FERRITIN, TIBC, IRON, RETICCTPCT in the last 72 hours. Sepsis Labs: No results for input(s): PROCALCITON, LATICACIDVEN in the last 168 hours.  Recent Results (from the past 240 hour(s))  Gram stain     Status: None   Collection Time: 04/05/20 10:36 AM   Specimen: Peritoneal Washings  Result Value Ref Range Status   Specimen Description PERITONEAL  Final   Special Requests FLUID  Final   Gram Stain   Final  WBC PRESENT, PREDOMINANTLY MONONUCLEAR NO ORGANISMS SEEN CYTOSPIN SMEAR Performed at [facility], [place] [address] [place], [place] [address]    Report Status 04/05/2020 FINAL  Final  Culture, body fluid-bottle     Status: None (Preliminary result)   Collection Time: 04/05/20 10:36 AM   Specimen: Peritoneal Washings  Result Value Ref Range Status   Specimen Description PERITONEAL  Final   Special Requests FLUID  Final   Culture   Final    NO GROWTH 1 DAY Performed at [facility] [address] [place], [place] [address]    Report Status PENDING  Incomplete  Resp Panel by RT-PCR (Flu A&B, Covid) Nasopharyngeal Swab     Status:  None   Collection Time: 04/05/20  2:11 PM   Specimen: Nasopharyngeal Swab; Nasopharyngeal(NP) swabs in vial transport medium  Result Value Ref Range Status   SARS Coronavirus 2 by RT PCR NEGATIVE NEGATIVE Final    Comment: (NOTE) SARS-CoV-2 target nucleic acids are NOT DETECTED.  The SARS-CoV-2 RNA is generally detectable in upper respiratory specimens during the acute phase of infection. The lowest concentration of SARS-CoV-2 viral copies this assay can detect is 138 copies/mL. A negative result does not preclude SARS-Cov-2 infection and should not be used as the sole basis for treatment or other patient management decisions. A negative result may occur with  improper specimen collection/handling, submission of specimen other than nasopharyngeal swab, presence of viral mutation(s) within the areas targeted by this assay, and inadequate number of viral copies(<138 copies/mL). A negative result must be combined with clinical observations, patient history, and epidemiological information. The expected result is Negative.  Fact Sheet for Patients:  EntrepreneurPulse.com.au  Fact Sheet for Healthcare Providers:  IncredibleEmployment.be  This test is no t yet approved or cleared by the [place] FDA and  has been authorized for detection and/or diagnosis of SARS-CoV-2 by FDA under an Emergency Use Authorization (EUA). This EUA will remain  in effect (meaning this test can be used) for the duration of the COVID-19 declaration under Section 564(b)(1) of the Act, 21 U.S.C.section 360bbb-3(b)(1), unless the authorization is terminated  or revoked sooner.       Influenza A by PCR NEGATIVE NEGATIVE Final   Influenza B by PCR NEGATIVE NEGATIVE Final    Comment: (NOTE) The Xpert Xpress SARS-CoV-2/FLU/RSV plus assay is intended as an aid in the diagnosis of influenza from Nasopharyngeal swab specimens and should not be used as a sole basis for  treatment. Nasal washings and aspirates are unacceptable for Xpert Xpress SARS-CoV-2/FLU/RSV testing.  Fact Sheet for Patients: EntrepreneurPulse.com.au  Fact Sheet for Healthcare Providers: IncredibleEmployment.be  This test is not yet approved or cleared by the [place] FDA and has been authorized for detection and/or diagnosis of SARS-CoV-2 by FDA under an Emergency Use Authorization (EUA). This EUA will remain in effect (meaning this test can be used) for the duration of the COVID-19 declaration under Section 564(b)(1) of the Act, 21 U.S.C. section 360bbb-3(b)(1), unless the authorization is terminated or revoked.  Performed at [facility], [place] [address] [place], [place] [address]          Radiology Studies: US Paracentesis  Result Date: 04/05/2020 INDICATION: Recurrent ascites secondary to alcoholic cirrhosis. Request for diagnostic and therapeutic paracentesis. EXAM: ULTRASOUND GUIDED PARACENTESIS MEDICATIONS: 1% lidocaine 10 mL COMPLICATIONS: None immediate. PROCEDURE: Informed written consent was obtained from the patient after a discussion of the risks, benefits and alternatives to treatment. A timeout was  performed prior to the initiation of the procedure. Initial ultrasound scanning demonstrates a moderate amount of ascites within the left lateral abdomen quadrant. The left lateral abdomen was prepped and draped in the usual sterile fashion. 1% lidocaine was used for local anesthesia. Following this, a 19 gauge, 7-cm, Yueh catheter was introduced. An ultrasound image was saved for documentation purposes. The paracentesis was performed. The catheter was removed and a dressing was applied. The patient tolerated the procedure well without immediate post procedural complication. FINDINGS: A total of approximately 2.5 L of clear yellow fluid was removed. Samples were sent to the laboratory as requested by the clinical team.  IMPRESSION: Successful ultrasound-guided paracentesis yielding 2.5 liters of peritoneal fluid. Read by: Gareth Eagle, PA-C Electronically Signed   By: Markus Daft M.D.   On: 04/05/2020 10:59        Scheduled Meds: . diphenhydrAMINE  50 mg Oral Once   Or  . diphenhydrAMINE  50 mg Intravenous Once  . docusate sodium  100 mg Oral BID  . folic acid  1 mg Oral Daily  . HYDROmorphone  2 mg Oral Q8H  . iron polysaccharides  150 mg Oral QODAY  . midodrine  10 mg Oral TID PC  . nicotine  7 mg Transdermal Daily  . pantoprazole  40 mg Oral Q0600  . predniSONE  50 mg Oral Q6H  . thiamine  100 mg Oral Daily   Continuous Infusions: . lactated ringers 75 mL/hr at 04/06/20 2325    Assessment & Plan:   Principal Problem:   End stage liver disease (HCC) Active Problems:   Decompensated hepatic cirrhosis (HCC)   Failure to thrive in adult   Hypotension, chronic   Abdominal pain   Other ascites   End stage liver disease -Abdominal pain, n/v appears to be related to ESLD - she has severe abdominal pain every few days and has seen GI for this issue -She was just discharged from [facility] 12/24 with plan for outpatient hepatology f/u in [place] on 1/4 for pre-transplant evaluation -She does not appear to be a current transplant candidate due to homelessness/unstable living environment (and so presumed inconsistent ability to obtain meds, f/u) and will need this to be addressed prior to transplant -She also uses routine marijuana and periodic tobacco -She has completed banding; patient is not a candidate for TIPS other than for salvage if life-threatening bleeding occurs -Patient reports alcohol cessation in 12/2018 12/26-status post paracentesis 2.5 L of clear yellow fluid on 12/25 with some improvement of her symptoms but still with abdominal pain 12/27-plan for CT scan after premedicated since has allergies We will follow up with GI for further plans based on CT finding Transfer was declined by  weight control at [facility] Possibly since her symptoms are better and if GI has no further input may be discharged home today, she does state she has blood work ordered for tomorrow as she is being evaluated for transplant on 1/4. Palliative care working with her on  establishing with goals of care, pain control and psychosocial support   Failure to thrive -It appears that she will be difficult to discharge since she is failing outpatient f/u -Her most appropriate options currently appear to be SNF placement and palliative care-however palliative care will try to talk to her sister about her going to her sisters.  Currently she is staying with friends 12/27-encourage p.o. intake 12/26-encouraged po intake.    Hypotension -Associated with 3rd spacing and intravascular volume depletion Asymptomatic Continue midodrine  drip  COVID-19 -Positive test on 12/13 but no symptoms during last hospitalization -She is vaccinated  Covid negative   Hypokalemia-K3.2 today  we will replace with KCl  Alcoholic cardiomyopathy -AICD in place, had cardiac arrest on 1/61 -Prior systolic CHF with EF 09-60% but this has recovered to 60% -Normal QTc at this time  Colon cancer -Reports in remission since 2019 -Interestingly, this was not really mentioned in her GI appointment in 11/2019 (establish care)  COVID-19 -Positive test on 12/13 but no symptoms during last hospitalization -She is vaccinated  -COVID negative    Note: This patient has been tested and is negative for the novel coronavirus COVID-19. The patient has been fully vaccinated against COVID-19.     DVT prophylaxis: scd Code Status:full Family Communication: none at bedside  Status is: Inpatient as  Inpatient level of care appropriate due to severity of illness  Dispo: The patient is from: Home              Anticipated d/c is to: Home              Anticipated d/c date is: 1              Patient currently is not medically  stable to d/c.CT scan ordered, need GI clearance.            LOS: 1 day   Time spent: 35 minutes with more than 50% on [place], MD [facility] Pager [phone]  If 7PM-7AM, please contact night-coverage 04/07/2020, 8:42 AM
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
## 2020-04-08 ENCOUNTER — Inpatient Hospital Stay (HOSPITAL_COMMUNITY): Payer: Medicare Other

## 2020-04-08 DIAGNOSIS — K7210 Chronic hepatic failure without coma: Secondary | ICD-10-CM | POA: Diagnosis not present

## 2020-04-08 HISTORY — PX: IR PARACENTESIS: IMG2679

## 2020-04-08 LAB — COMPREHENSIVE METABOLIC PANEL
ALT: 35 U/L (ref 0–44)
ALT: 31 U/L (ref 0–44)
AST: 89 U/L — ABNORMAL HIGH (ref 15–41)
AST: 88 U/L — ABNORMAL HIGH (ref 15–41)
Albumin: 1.8 g/dL — ABNORMAL LOW (ref 3.5–5.0)
Albumin: 2.1 g/dL — ABNORMAL LOW (ref 3.5–5.0)
Alkaline Phosphatase: 114 U/L (ref 38–126)
Alkaline Phosphatase: 106 U/L (ref 38–126)
Anion gap: 8 (ref 5–15)
Anion gap: 9 (ref 5–15)
BUN: <5 mg/dL — ABNORMAL LOW (ref 6–20)
BUN: 4 mg/dL — ABNORMAL LOW (ref 6–20)
CO2: 22 mmol/L (ref 22–32)
CO2: 24 mmol/L (ref 22–32)
Calcium: 8.5 mg/dL — ABNORMAL LOW (ref 8.9–10.3)
Calcium: 8 mg/dL — ABNORMAL LOW (ref 8.9–10.3)
Chloride: 107 mmol/L (ref 98–111)
Chloride: 102 mmol/L (ref 98–111)
Creatinine, Ser: 0.78 mg/dL (ref 0.44–1.00)
Creatinine, Ser: 0.79 mg/dL (ref 0.44–1.00)
GFR, Estimated: >60 mL/min (ref >60)
GFR, Estimated: >60 mL/min (ref >60)
Glucose, Bld: 127 mg/dL — ABNORMAL HIGH (ref 70–99)
Glucose, Bld: 114 mg/dL — ABNORMAL HIGH (ref 70–99)
Potassium: 3.5 mmol/L (ref 3.5–5.1)
Potassium: 2.9 mmol/L — ABNORMAL LOW (ref 3.5–5.1)
Sodium: 137 mmol/L (ref 135–145)
Sodium: 135 mmol/L (ref 135–145)
Total Bilirubin: 20.7 mg/dL (ref 0.3–1.2)
Total Bilirubin: 17.7 mg/dL — ABNORMAL HIGH (ref 0.3–1.2)
Total Protein: 6.1 g/dL — ABNORMAL LOW (ref 6.5–8.1)
Total Protein: 5.5 g/dL — ABNORMAL LOW (ref 6.5–8.1)

## 2020-04-08 LAB — CBC
HCT: 32.3 % — ABNORMAL LOW (ref 36.0–46.0)
Hemoglobin: 10.7 g/dL — ABNORMAL LOW (ref 12.0–15.0)
MCH: 33.2 pg (ref 26.0–34.0)
MCHC: 33.1 g/dL (ref 30.0–36.0)
MCV: 100.3 fL — ABNORMAL HIGH (ref 80.0–100.0)
Platelets: 133 10*3/uL — ABNORMAL LOW (ref 150–400)
RBC: 3.22 MIL/uL — ABNORMAL LOW (ref 3.87–5.11)
RDW: 20.9 % — ABNORMAL HIGH (ref 11.5–15.5)
WBC: 12.8 10*3/uL — ABNORMAL HIGH (ref 4.0–10.5)
nRBC: 0 % (ref 0.0–0.2)

## 2020-04-08 LAB — PROTIME-INR
INR: 1.8 — ABNORMAL HIGH (ref 0.8–1.2)
Prothrombin Time: 20.3 seconds — ABNORMAL HIGH (ref 11.4–15.2)

## 2020-04-08 MED ORDER — LIDOCAINE HCL 1 % IJ SOLN
INTRAMUSCULAR | Status: AC | PRN
  Administered 2020-04-08: 20 mL via INTRADERMAL

## 2020-04-08 MED ORDER — LIDOCAINE HCL 1 % IJ SOLN
INTRAMUSCULAR | Status: AC
  Filled 2020-04-08: qty 20

## 2020-04-08 MED ORDER — ALBUMIN HUMAN 25 % IV SOLN
12.5000 g | Freq: Once | INTRAVENOUS | Status: AC
  Administered 2020-04-08: 12.5 g via INTRAVENOUS
  Filled 2020-04-08: qty 50

## 2020-04-08 NOTE — Progress Notes (Addendum)
Daily Rounding Note  04/08/2020, 8:32 AM  LOS: 2 days   SUBJECTIVE:   Chief complaint: decompensated cirrhosis.       BPS 80s to 90s/50s to 60s, stable.  HR 70s to 80s.   Not dizzy.  Some fatigue but no profound weakness. Continues to have abdominal distention, associated with some discomfort.  Sites where she has been tapped are leaking ascites.   OBJECTIVE:         Vital signs in last 24 hours:    Temp:  [98 F (36.7 C)-98.6 F (37 C)] 98.6 F (37 C) (12/28 [id number]) Pulse Rate:  [70-83] 83 (12/28 0608) Resp:  [14-16] 16 (12/28 0608) BP: (94-98)/(61-67) 94/64 (12/28 0608) SpO2:  [95 %-97 %] 97 % (12/28 [id number]) Weight:  [54.8 kg] 54.8 kg (12/27 2220) Last BM Date: 04/07/20 Filed Weights   04/05/20 0719 04/05/20 1745 04/07/20 2220  Weight: 45.4 kg 51.8 kg 54.8 kg   General: Jaundiced, chronically ill looking but alert and comfortable Heart: RRR Chest: Clear without labored breathing Abdomen: Soft but mildly distended.  Some diffuse, mild tenderness.  Ascites/fluid wave. Extremities: No CCE Neuro/Psych: No asterixis.  Alert and oriented x3.  Intake/Output from previous day: 12/27 0701 - 12/28 0700 In: 300 [P.O.:300] Out: 0   Intake/Output this shift: No intake/output data recorded.  Lab Results: Recent Labs    04/06/20 0645 04/08/20 0324  WBC 8.3 12.8*  HGB 10.7* 10.7*  HCT 30.4* 32.3*  PLT 114* 133*   BMET Recent Labs    04/06/20 0645 04/08/20 0324  NA 135 137  K 3.2* 3.5  CL 105 107  CO2 21* 22  GLUCOSE 83 127*  BUN <5* <5*  CREATININE 0.70 0.78  CALCIUM 8.1* 8.5*   LFT Recent Labs    04/08/20 0324  PROT 6.1*  ALBUMIN 1.8*  AST 89*  ALT 35  ALKPHOS 114  BILITOT 20.7*   PT/INR Recent Labs    04/08/20 0324  LABPROT 20.3*  INR 1.8*   Hepatitis Panel No results for input(s): HEPBSAG, HCVAB, HEPAIGM, HEPBIGM in the last 72 hours.  Studies/Results: CT ABDOMEN PELVIS W  CONTRAST  Result Date: 04/07/2020 CLINICAL DATA:  Diffuse abdominal pain for several days. EXAM: CT ABDOMEN AND PELVIS WITH CONTRAST TECHNIQUE: Multidetector CT imaging of the abdomen and pelvis was performed using the standard protocol following bolus administration of intravenous contrast. CONTRAST:  173mL OMNIPAQUE IOHEXOL 300 MG/ML  SOLN COMPARISON:  01/27/2018 FINDINGS: Lower chest: Trace bilateral pleural effusions with overlying areas of mild platelike atelectasis. Hepatobiliary: Diffuse hepatic steatosis with morphologic changes of cirrhosis. There is a suspicion of a focal low-attenuation structure within the inferior right hepatic lobe measuring 3.1 cm, image 26/3 and image 73/6. gallbladder wall appears diffusely edematous, likely reflecting portal venous hypertension. No biliary ductal dilatation. Pancreas: Unremarkable. No pancreatic ductal dilatation or surrounding inflammatory changes. Spleen: Measures 8.6 x 5.2 by 11.2 cm (volume = 260 cm^3) Adrenals/Urinary Tract: Normal adrenal glands. No kidney mass or hydronephrosis identified. Urinary bladder is unremarkable. Stomach/Bowel: Stomach is nondistended. No bowel wall thickening, inflammation or distension. Vascular/Lymphatic: Normal appearance of the abdominal aorta. No aneurysm. Portal vein appears increased in caliber measuring 1.6 cm. Upper abdominal varices including recanalization of the umbilical vein. No abdominopelvic adenopathy. Reproductive: Uterus and bilateral adnexa are unremarkable. Other: Large volume of ascites is identified within the abdomen and pelvis. This is new when compared with 01/27/2018. Musculoskeletal: No acute or significant osseous findings. IMPRESSION:  1. Morphologic features of the liver compatible with cirrhosis. Stigmata of portal venous hypertension is noted including large volume of ascites and upper abdominal varices. The spleen has a normal size. 2. There is a suspicion of a focal low-attenuation structure  within the inferior right hepatic lobe measuring 3.1 cm. When the patient is clinically stable and able to follow directions and hold their breath (preferably as an outpatient) further evaluation with dedicated abdominal MRI should be considered. 3. Trace bilateral pleural effusions. Electronically Signed   By: Signa Kell M.D.   On: 04/07/2020 11:49   Scheduled Meds: . (feeding supplement) PROSource Plus  30 mL Oral BID BM  . docusate sodium  100 mg Oral BID  . folic acid  1 mg Oral Daily  . furosemide  20 mg Oral Daily  . HYDROmorphone  2 mg Oral Q8H  . iron polysaccharides  150 mg Oral QODAY  . midodrine  10 mg Oral TID PC  . multivitamin with minerals  1 tablet Oral Daily  . nicotine  7 mg Transdermal Daily  . pantoprazole  40 mg Oral Q0600  . spironolactone  50 mg Oral Daily  . thiamine  100 mg Oral Daily   Continuous Infusions: PRN Meds:.albuterol, bisacodyl, melatonin, morphine injection, ondansetron **OR** ondansetron (ZOFRAN) IV, polyethylene glycol   ASSESMENT:   *    Abdominal pain.  CTAP w contrast confirms diagnosis of cirrhosis, normal-sized spleen, stigmata of portal hypertension, large volume ascites, upper abdominal varices.  Low attenuation 3.1 cm structure in the inferior right hepatic lobe.  Could be worked up further with MRI. AFP 3.3 on 04/2018.     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 MELD-Na 26 today.  Was 25 on 12/25.    *    Variceal bleed, hospitalized at [facility] 12/12 -04/04/2020. Hematemesis addressed with EGD 12/16, esophageal varices banded. 1/4 appointment set up with hepatology, transplant team at [facility], Dr Nestor Ramp.  *   Coagulopathy.  INR rising.    *   Normocytic anemia.  Po iron, folate in place.  Hgb stable.    *   Thrombocytopenia.  Improved.    *    Ascites.  2.5 L paracentesis on 12/25, negative for SBP. Previously required periodic paracentesis per her primary GI MD.   Previous diuretics  discontinued due to hypotension. Chronic midodrine. Day 2 aldactone 50/day, lasix 20/day.    *   Hx hepatic encephalopathy.  *    Homeless.      *   VT arrest 10/2019.  S/p ICD.    PLAN   *   AFP level.  Defer MRI abdomen/liver to MDs at [facility], as they likely prefer their in house radiologists to perform this.     *   Paracentesis of up to 4 liters, albumin afterwards and then discharge pt to fup w Dr Tillie Rung next week.  Continue low dose aldactone and lasix.   Her local primary GI MD is Dr Salvadore Oxford at [place].    *   May need to consider TIPS but defer to Dr Carrie Mew and Dr Tilda Burrow.  *   Continue low Na diet  *   CMET within next 7 to 10 days.      *   GI is signing off.      Jennye Moccasin  04/08/2020, 8:32 AM Phone [phone]     Attending physician's note   I have reviewed the chart  and didn't examine the patient.  She has gone to IR for paracentesis. I agree with the Advanced Practitioner's note, impression and recommendations.   End-stage liver disease, variceal bleeding s/p EVL at [facility], refractory ascites s/p multiple LVPs.  Being considered for liver transplant.  MELD-Na score 26 (more or less unchanged)  Note CT AP with contrast showing ?3.1 cm lesion in the right inferior hepatic lobe.   Plan: -Check AFP -Will defer MRI liver to Dr Gertie Fey who has been closely following her. -Continue low-dose diuretics. -LVP PRN -Give copy of the CT scan to the patient at D/C -Anticipate discharge in a.m.   Carmell Austria, MD [facility] [phone]

## 2020-04-08 NOTE — Procedures (Signed)
PROCEDURE SUMMARY:  Successful US guided paracentesis from left abdomen.  Yielded 1.6 L of clear yellow fluid.  No immediate complications.  Pt tolerated well.   EBL < 2 mL  Mickie Kay, NP 04/08/2020 4:49 PM

## 2020-04-08 NOTE — Progress Notes (Signed)
PROGRESS NOTE    Rachel Fuller  [id number] DOB: Jul 20, 1980 DOA: 04/05/2020 PCP: [facility]    Brief Narrative:  Rachel Fuller is a 39 y.o. 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   12/27-getting CT scan today. Needs to be premedicated  Consultants:   GI  Procedures:   Antimicrobials:       Subjective: Reports abdominal pain is better.  No nausea or vomiting  Objective: Vitals:   04/07/20 1700 04/07/20 2220 04/08/20 0608 04/08/20 1035  BP: 97/61 98/67 94/64  93/60  Pulse: 73 70 83 79  Resp: 16 14 16 18   Temp: 98.1 F (36.7 C) 98 F (36.7 C) 98.6 F (37 C) 98.5 F (36.9 C)  TempSrc:  Oral Oral Oral  SpO2: 96% 95% 97% 95%  Weight:  54.8 kg    Height:        Intake/Output Summary (Last 24 hours) at 04/08/2020 1529 Last data filed at 04/08/2020 1400 Gross per 24 hour  Intake 780 ml  Output 900 ml  Net -120 ml   Filed Weights   04/05/20 0719 04/05/20 1745 04/07/20 2220  Weight: 45.4 kg 51.8 kg 54.8 kg    Examination: Calm, comfortable CTA no wheeze rales rhonchi's Regular S1-S2 Distended, soft, nontender to palpation positive bowel sounds No edema Alert oriented x3, grossly intact    Data Reviewed: I have personally reviewed following labs and imaging studies  CBC: Recent Labs  Lab 04/05/20 0731 04/06/20 0645 04/08/20 0324  WBC 10.0 8.3 12.8*  NEUTROABS 9.0*  --   --   HGB 11.6* 10.7* 10.7*  HCT 33.9* 30.4* 32.3*  MCV 98.3 99.0 100.3*  PLT 132* 114* [id number]*   Basic Metabolic Panel: Recent Labs  Lab 04/05/20 0731 04/06/20 0645 04/08/20 0324  NA 134* 135 137  K 3.7 3.2* 3.5  CL 102 105 107  CO2 19* 21* 22  GLUCOSE 115* 83 127*  BUN <5* <5* <5*  CREATININE 0.74 0.70 0.78   CALCIUM 8.5* 8.1* 8.5*   GFR: Estimated Creatinine Clearance: 69.8 mL/min (by C-G formula based on SCr of 0.78 mg/dL). Liver Function Tests: Recent Labs  Lab 04/05/20 0731 04/08/20 0324  AST 103* 89*  ALT 32 35  ALKPHOS 129* 114  BILITOT 23.4* 20.7*  PROT 6.4* 6.1*  ALBUMIN 2.1* 1.8*   Recent Labs  Lab 04/05/20 0731  LIPASE 30   Recent Labs  Lab 04/05/20 0731  AMMONIA 70*   Coagulation Profile: Recent Labs  Lab 04/05/20 0731 04/08/20 0324  INR 1.7* 1.8*   Cardiac Enzymes: No results for input(s): CKTOTAL, CKMB, CKMBINDEX, TROPONINI in the last 168 hours. BNP (last 3 results) No results for input(s): PROBNP in the last 8760 hours. HbA1C: No results for input(s): HGBA1C in the last 72 hours. CBG: Recent Labs  Lab 04/05/20 1749  GLUCAP 80   Lipid Profile: No results for input(s): CHOL, HDL, LDLCALC, TRIG, CHOLHDL, LDLDIRECT in the last 72 hours. Thyroid Function Tests: No results for input(s): TSH, T4TOTAL, FREET4, T3FREE, THYROIDAB in the last 72 hours. Anemia Panel: No results for input(s): VITAMINB12, FOLATE, FERRITIN, TIBC, IRON, RETICCTPCT in the last 72 hours. Sepsis Labs: No results for input(s): PROCALCITON, LATICACIDVEN in the last 168 hours.  Recent Results (from the past 240 hour(s))  Gram stain     Status: None  Collection Time: 04/05/20 10:36 AM   Specimen: Peritoneal Washings  Result Value Ref Range Status   Specimen Description PERITONEAL  Final   Special Requests FLUID  Final   Gram Stain   Final    WBC PRESENT, PREDOMINANTLY MONONUCLEAR NO ORGANISMS SEEN CYTOSPIN SMEAR Performed at [facility], [address], [place], [place] [address]    Report Status 04/05/2020 FINAL  Final  Culture, body fluid-bottle     Status: None (Preliminary result)   Collection Time: 04/05/20 10:36 AM   Specimen: Peritoneal Washings  Result Value Ref Range Status   Specimen Description PERITONEAL  Final   Special Requests FLUID  Final    Culture   Final    NO GROWTH 3 DAYS Performed at [facility], [address] [place], [place] [address]    Report Status PENDING  Incomplete  Resp Panel by RT-PCR (Flu A&B, Covid) Nasopharyngeal Swab     Status: None   Collection Time: 04/05/20  2:11 PM   Specimen: Nasopharyngeal Swab; Nasopharyngeal(NP) swabs in vial transport medium  Result Value Ref Range Status   SARS Coronavirus 2 by RT PCR NEGATIVE NEGATIVE Final    Comment: (NOTE) SARS-CoV-2 target nucleic acids are NOT DETECTED.  The SARS-CoV-2 RNA is generally detectable in upper respiratory specimens during the acute phase of infection. The lowest concentration of SARS-CoV-2 viral copies this assay can detect is 138 copies/mL. A negative result does not preclude SARS-Cov-2 infection and should not be used as the sole basis for treatment or other patient management decisions. A negative result may occur with  improper specimen collection/handling, submission of specimen other than nasopharyngeal swab, presence of viral mutation(s) within the areas targeted by this assay, and inadequate number of viral copies(<138 copies/mL). A negative result must be combined with clinical observations, patient history, and epidemiological information. The expected result is Negative.  Fact Sheet for Patients:  BloggerCourse.com  Fact Sheet for Healthcare Providers:  SeriousBroker.it  This test is no t yet approved or cleared by the [place] FDA and  has been authorized for detection and/or diagnosis of SARS-CoV-2 by FDA under an Emergency Use Authorization (EUA). This EUA will remain  in effect (meaning this test can be used) for the duration of the COVID-19 declaration under Section 564(b)(1) of the Act, 21 U.S.C.section 360bbb-3(b)(1), unless the authorization is terminated  or revoked sooner.       Influenza A by PCR NEGATIVE NEGATIVE Final   Influenza B by PCR  NEGATIVE NEGATIVE Final    Comment: (NOTE) The Xpert Xpress SARS-CoV-2/FLU/RSV plus assay is intended as an aid in the diagnosis of influenza from Nasopharyngeal swab specimens and should not be used as a sole basis for treatment. Nasal washings and aspirates are unacceptable for Xpert Xpress SARS-CoV-2/FLU/RSV testing.  Fact Sheet for Patients: BloggerCourse.com  Fact Sheet for Healthcare Providers: SeriousBroker.it  This test is not yet approved or cleared by the [place] FDA and has been authorized for detection and/or diagnosis of SARS-CoV-2 by FDA under an Emergency Use Authorization (EUA). This EUA will remain in effect (meaning this test can be used) for the duration of the COVID-19 declaration under Section 564(b)(1) of the Act, 21 U.S.C. section 360bbb-3(b)(1), unless the authorization is terminated or revoked.  Performed at [facility], [address], [place], [place] [address]          Radiology Studies: CT ABDOMEN PELVIS W CONTRAST  Result Date: 04/07/2020 CLINICAL DATA:  Diffuse abdominal  pain for several days. EXAM: CT ABDOMEN AND PELVIS WITH CONTRAST TECHNIQUE: Multidetector CT imaging of the abdomen and pelvis was performed using the standard protocol following bolus administration of intravenous contrast. CONTRAST:  121mL OMNIPAQUE IOHEXOL 300 MG/ML  SOLN COMPARISON:  01/27/2018 FINDINGS: Lower chest: Trace bilateral pleural effusions with overlying areas of mild platelike atelectasis. Hepatobiliary: Diffuse hepatic steatosis with morphologic changes of cirrhosis. There is a suspicion of a focal low-attenuation structure within the inferior right hepatic lobe measuring 3.1 cm, image 26/3 and image 73/6. gallbladder wall appears diffusely edematous, likely reflecting portal venous hypertension. No biliary ductal dilatation. Pancreas: Unremarkable. No pancreatic ductal dilatation or surrounding  inflammatory changes. Spleen: Measures 8.6 x 5.2 by 11.2 cm (volume = 260 cm^3) Adrenals/Urinary Tract: Normal adrenal glands. No kidney mass or hydronephrosis identified. Urinary bladder is unremarkable. Stomach/Bowel: Stomach is nondistended. No bowel wall thickening, inflammation or distension. Vascular/Lymphatic: Normal appearance of the abdominal aorta. No aneurysm. Portal vein appears increased in caliber measuring 1.6 cm. Upper abdominal varices including recanalization of the umbilical vein. No abdominopelvic adenopathy. Reproductive: Uterus and bilateral adnexa are unremarkable. Other: Large volume of ascites is identified within the abdomen and pelvis. This is new when compared with 01/27/2018. Musculoskeletal: No acute or significant osseous findings. IMPRESSION: 1. Morphologic features of the liver compatible with cirrhosis. Stigmata of portal venous hypertension is noted including large volume of ascites and upper abdominal varices. The spleen has a normal size. 2. There is a suspicion of a focal low-attenuation structure within the inferior right hepatic lobe measuring 3.1 cm. When the patient is clinically stable and able to follow directions and hold their breath (preferably as an outpatient) further evaluation with dedicated abdominal MRI should be considered. 3. Trace bilateral pleural effusions. Electronically Signed   By: Kerby Moors M.D.   On: 04/07/2020 11:49        Scheduled Meds: . (feeding supplement) PROSource Plus  30 mL Oral BID BM  . docusate sodium  100 mg Oral BID  . folic acid  1 mg Oral Daily  . furosemide  20 mg Oral Daily  . HYDROmorphone  2 mg Oral Q8H  . iron polysaccharides  150 mg Oral QODAY  . midodrine  10 mg Oral TID PC  . multivitamin with minerals  1 tablet Oral Daily  . nicotine  7 mg Transdermal Daily  . pantoprazole  40 mg Oral Q0600  . spironolactone  50 mg Oral Daily  . thiamine  100 mg Oral Daily   Continuous Infusions: . albumin human       Assessment & Plan:   Principal Problem:   End stage liver disease (HCC) Active Problems:   Decompensated hepatic cirrhosis (HCC)   Failure to thrive in adult   Hypotension, chronic   Abdominal pain   Other ascites   End stage liver disease -Abdominal pain, n/v appears to be related to ESLD - she has severe abdominal pain every few days and has seen GI for this issue -She was just discharged from [facility] 12/24 with plan for outpatient hepatology f/u in [place] on 1/4 for pre-transplant evaluation -She does not appear to be a current transplant candidate due to homelessness/unstable living environment (and so presumed inconsistent ability to obtain meds, f/u) and will need this to be addressed prior to transplant -She also uses routine marijuana and periodic tobacco -She has completed banding; patient is not a candidate for TIPS other than for salvage if life-threatening bleeding occurs -Patient reports alcohol cessation in 12/2018 -  evaluated for transplant on 1/4. -Palliative care working with her on  establishing with goals of care, pain control and psychosocial support 12/26-status post paracentesis 2.5 L of clear yellow fluid on 12/25 with some improvement of her symptoms but still with abdominal pain 12/27-plan for CT scan after premedicated since has allergies 12/28-CT completed , see report. Gi recommends paracentesis up to 4 L, albumin afterwards and was follow-up with Dr. Clarita Leber next week Continue low-dose Aldactone and Lasix May need to consider TIPS but [facility] Marcelo Baldy ( primary GI doctor) Continue low-sodium diet C-et within the next 7 to 10 days GI signed off Plan: Paracentesis and albumin. Will need to observe overnight if removing up to 4L fluid make sure pt does not become more hypotensive .    Failure to thrive -It appears that she will be difficult to discharge since she is failing outpatient f/u -Her most appropriate options  currently appear to be SNF placement and palliative care-however palliative care will try to talk to her sister about her going to her sisters.  Currently she is staying with friends 12/28-encouraged po intake Nutrition following    Hypotension -Associated with 3rd spacing and intravascular volume depletion -feels tired after aldactone and lasix added.  -monitor bp especially after paracentesis Continue midodrine   Hypokalemia- Replaced stable Now on aldactone     COVID-19 -Positive test on 12/13 but no symptoms during last hospitalization -She is vaccinated  Covid negative     Alcoholic cardiomyopathy -AICD in place, had cardiac arrest on 0000000 -Prior systolic CHF with EF [id number] but this has recovered to 60% -Normal QTc at this time  Colon cancer -Reports in remission since 2019 -Interestingly, this was not really mentioned in her GI appointment in 11/2019 (establish care)  COVID-19 -Positive test on 12/13 but no symptoms during last hospitalization -She is vaccinated  -COVID negative    Note: This patient has been tested and is negative for the novel coronavirus COVID-19. The patient has been fully vaccinated against COVID-19.     DVT prophylaxis: scd Code Status:full Family Communication: none at bedside  Status is: Inpatient as  Inpatient level of care appropriate due to severity of illness  Dispo: The patient is from: Home              Anticipated d/c is to: Home              Anticipated d/c date is: 1              Patient currently is not medically stable to d/c.getting paracentesis with high fluid to be taken out , needs iv albumin. Monitor post paracentesis to make sure hemodynamically stable.         LOS: 2 days   Time spent: 35 minutes with more than 50% on [place], MD [facility] Pager [phone]  If 7PM-7AM, please contact night-coverage 04/08/2020, 3:29 PM

## 2020-04-08 NOTE — Progress Notes (Signed)
   04/08/20 1030  Clinical Encounter Type  Visited With Patient  Visit Type Initial  Referral From Nurse  Consult/Referral To Chaplain  Spiritual Encounters  Spiritual Needs  (Advance Directive Health Care Power of Attorney)  Chaplain responded to consult for Rachel Fuller. AD was completed, notarized with 2 witnesses. Original given to Rachel Fuller a copy for her Primary and one placed in her chart.   Chaplain Carlee Tesfaye Morgan-Simpson  [phone]

## 2020-04-09 DIAGNOSIS — K7210 Chronic hepatic failure without coma: Secondary | ICD-10-CM | POA: Diagnosis not present

## 2020-04-09 DIAGNOSIS — E440 Moderate protein-calorie malnutrition: Secondary | ICD-10-CM | POA: Insufficient documentation

## 2020-04-09 LAB — BASIC METABOLIC PANEL
Anion gap: 9 (ref 5–15)
Anion gap: 8 (ref 5–15)
BUN: 6 mg/dL (ref 6–20)
BUN: 5 mg/dL — ABNORMAL LOW (ref 6–20)
CO2: 27 mmol/L (ref 22–32)
CO2: 26 mmol/L (ref 22–32)
Calcium: 8.2 mg/dL — ABNORMAL LOW (ref 8.9–10.3)
Calcium: 8 mg/dL — ABNORMAL LOW (ref 8.9–10.3)
Chloride: 98 mmol/L (ref 98–111)
Chloride: 99 mmol/L (ref 98–111)
Creatinine, Ser: 0.72 mg/dL (ref 0.44–1.00)
Creatinine, Ser: 0.7 mg/dL (ref 0.44–1.00)
GFR, Estimated: >60 mL/min (ref >60)
GFR, Estimated: >60 mL/min (ref >60)
Glucose, Bld: 103 mg/dL — ABNORMAL HIGH (ref 70–99)
Glucose, Bld: 124 mg/dL — ABNORMAL HIGH (ref 70–99)
Potassium: 2.7 mmol/L — CL (ref 3.5–5.1)
Potassium: 2.8 mmol/L — ABNORMAL LOW (ref 3.5–5.1)
Sodium: 134 mmol/L — ABNORMAL LOW (ref 135–145)
Sodium: 133 mmol/L — ABNORMAL LOW (ref 135–145)

## 2020-04-09 LAB — CBC
HCT: 27.7 % — ABNORMAL LOW (ref 36.0–46.0)
Hemoglobin: 10 g/dL — ABNORMAL LOW (ref 12.0–15.0)
MCH: 35.2 pg — ABNORMAL HIGH (ref 26.0–34.0)
MCHC: 36.1 g/dL — ABNORMAL HIGH (ref 30.0–36.0)
MCV: 97.5 fL (ref 80.0–100.0)
Platelets: 100 10*3/uL — ABNORMAL LOW (ref 150–400)
RBC: 2.84 MIL/uL — ABNORMAL LOW (ref 3.87–5.11)
RDW: 20.1 % — ABNORMAL HIGH (ref 11.5–15.5)
WBC: 7.6 10*3/uL (ref 4.0–10.5)
nRBC: 0 % (ref 0.0–0.2)

## 2020-04-09 LAB — MAGNESIUM: Magnesium: 1.9 mg/dL (ref 1.7–2.4)

## 2020-04-09 MED ORDER — POTASSIUM CHLORIDE CRYS ER 20 MEQ PO TBCR
40.0000 meq | EXTENDED_RELEASE_TABLET | ORAL | Status: AC
  Administered 2020-04-09: 40 meq via ORAL
  Filled 2020-04-09: qty 2

## 2020-04-09 MED ORDER — POTASSIUM CHLORIDE CRYS ER 20 MEQ PO TBCR
40.0000 meq | EXTENDED_RELEASE_TABLET | Freq: Once | ORAL | Status: AC
  Administered 2020-04-09: 40 meq via ORAL
  Filled 2020-04-09: qty 2

## 2020-04-09 NOTE — TOC Initial Note (Signed)
Transition of Care [facility]) - Initial/Assessment Note    Patient Details  Name: Rachel Fuller MRN: [id number] Date of Birth: 1980/07/31  [facility] CM/SW Contact:    Bess Kinds, RN Phone Number: [phone] 04/09/2020, 10:18 AM  Clinical Narrative:                  Spoke with patient at the bedside to discuss transition plans. Patient stated that she has somewhere to go today and additional plans in progress. Her sister, [name], is assisting her. She has transportation at discharge.   Verified PCP and pharmacy in Epic as correct. She is interested in using [facility] pharmacy for discharge medications.   TOC following for transition needs.   Expected Discharge Plan: Home/Self Care Barriers to Discharge: Continued Medical Work up   Patient Goals and CMS Choice   CMS Medicare.gov Compare Post Acute Care list provided to:: Patient Choice offered to / list presented to : NA  Expected Discharge Plan and Services Expected Discharge Plan: Home/Self Care In-house Referral: NA Discharge Planning Services: CM Consult Post Acute Care Choice: NA                   DME Arranged: N/A DME Agency: NA       HH Arranged: NA HH Agency: NA        Prior Living Arrangements/Services   Lives with:: Self Patient language and need for interpreter reviewed:: Yes Do you feel safe going back to the place where you live?: Yes      Need for Family Participation in Patient Care: Yes (Comment) Care giver support system in place?: Yes (comment)   Criminal Activity/Legal Involvement Pertinent to Current Situation/Hospitalization: No - Comment as needed  Activities of Daily Living Home Assistive Devices/Equipment: Eyeglasses ADL Screening (condition at time of admission) Patient's cognitive ability adequate to safely complete daily activities?: Yes Is the patient deaf or have difficulty hearing?: No Does the patient have difficulty seeing, even when wearing glasses/contacts?:  No Does the patient have difficulty concentrating, remembering, or making decisions?: No Patient able to express need for assistance with ADLs?: No Does the patient have difficulty dressing or bathing?: No Independently performs ADLs?: Yes (appropriate for developmental age) Does the patient have difficulty walking or climbing stairs?: Yes Weakness of Legs: Both Weakness of Arms/Hands: Both  Permission Sought/Granted                  Emotional Assessment Appearance:: Appears stated age Attitude/Demeanor/Rapport: Engaged Affect (typically observed): Accepting Orientation: : Oriented to Self,Oriented to Place,Oriented to  Time,Oriented to Situation Alcohol / Substance Use: Alcohol Use (has been dry since 12/12/2018) Psych Involvement: No (comment)  Admission diagnosis:  Abdominal pain [R10.9] End stage liver disease (HCC) [K72.10] Chronic liver failure without hepatic coma (HCC) [K72.10] Intractable vomiting with nausea, unspecified vomiting type [R11.2] Patient Active Problem List   Diagnosis Date Noted  . Malnutrition of moderate degree 04/09/2020  . Abdominal pain   . Other ascites   . End stage liver disease (HCC) 04/05/2020  . Failure to thrive in adult 04/05/2020  . Hypotension, chronic 04/05/2020  . Decompensated hepatic cirrhosis (HCC) 01/29/2020   PCP:  [facility]:   [facility] - [place], [place] - [facility] [address] [facility] [place] [address] Phone: [phone] Fax: [phone]     Social Determinants of Health (SDOH) Interventions    Readmission Risk Interventions No flowsheet data found.

## 2020-04-09 NOTE — Progress Notes (Signed)
PROGRESS NOTE    Rachel Fuller  [id number] DOB: 1980/09/25 DOA: 04/05/2020 PCP: [facility]   Brief Narrative: 39 year old with past medical history significant for TBI, PCKD, A. 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  Of note patient was recently discharged from [facility] 12/12 until 12/24 for hematemesis.  Had esophageal varices banded. She has an appointment scheduled with hepatology transplant team [facility]  on January 4.  Assessment & Plan:   Principal Problem:   End stage liver disease ([place]) Active Problems:   Decompensated hepatic cirrhosis (HCC)   Failure to thrive in adult   Hypotension, chronic   Abdominal pain   Other ascites   Malnutrition of moderate degree   1-end-stage liver disease: Patient presented with abdominal pain. She was just discharged from [facility] on 12/24 with plan for outpatient hematology follow-up in [place] January 4 for pretransplant evaluation Reported alcohol cessation in 12/2018. Had a CT abdomen finding consistent with cirrhosis.  GI recommended paracentesis. #1 paracentesis on 12/28.  Received Albumin Continue with Lasix and spironolactone  2-Hypokalemia: replete with oral potasium Check magnesium  3-hypotension: Continue with midodrine  COVID-19 -Positive test on 12/13 but no symptoms during last hospitalization -She is vaccinated  Covid negative  History of colon cancer reported in remission [id number] Alcoholic Cardiomyopathy: AICD in place, had cardiac arrest 7/29 Prior ejection fraction 25 to 30% but this has recovered ejection fraction 60%     Nutrition Problem: Moderate Malnutrition Etiology: chronic illness (cirrhosis of the liver due to alcoholism)    Signs/Symptoms: moderate fat  depletion,severe muscle depletion,moderate muscle depletion,energy intake < or equal to 75% for > or equal to 1 month    Interventions: Refer to RD note for recommendations,[employer] Instant Breakfast,Prostat  Estimated body mass index is 22.83 kg/m as calculated from the following:   Height as of this encounter: 5\' 1"  (1.549 m).   Weight as of this encounter: 54.8 kg.   DVT prophylaxis: SCD Code Status: Full code Family Communication: care discussed with patient Disposition Plan:  Status is: Inpatient  Remains inpatient appropriate because:Persistent severe electrolyte disturbances   Dispo: The patient is from: Home              Anticipated d/c is to: Home              Anticipated d/c date is: 1 day              Patient currently is not medically stable to d/c.        Consultants:   GI  IR  Procedures:  Paracentesis  Antimicrobials:    Subjective: She report some abdominal pain.  She relates yesterday during procedure the went "through her bowel/""  Objective: Vitals:   04/08/20 2045 04/09/20 0302 04/09/20 0609 04/09/20 0908  BP: 92/60 98/66 101/65 90/68  Pulse:  85 79 73  Resp:   18 18  Temp:   98.4 F (36.9 C) 98.1 F (36.7 C)  TempSrc:    Oral  SpO2:   99% 97%  Weight:      Height:        Intake/Output Summary (Last 24 hours) at 04/09/2020 1715 Last data filed at 04/09/2020 1507 Gross per 24 hour  Intake 697.97 ml  Output 0 ml  Net 697.97 ml   Filed Weights   04/05/20  1745 04/07/20 2220 04/08/20 2041  Weight: 51.8 kg 54.8 kg 54.8 kg    Examination:  General exam: Appears calm and comfortable  Respiratory system: Clear to auscultation. Respiratory effort normal. Cardiovascular system: S1 & S2 heard, RRR. No JVD, murmurs, rubs, gallops or clicks. No pedal edema. Gastrointestinal system: Abdomen is mild distended, soft, mild tender. Central nervous system: Alert and oriented. No focal neurological deficits. Extremities: Symmetric 5 x  5 power.    Data Reviewed: I have personally reviewed following labs and imaging studies  CBC: Recent Labs  Lab 04/05/20 0731 04/06/20 0645 04/08/20 0324 04/09/20 0418  WBC 10.0 8.3 12.8* 7.6  NEUTROABS 9.0*  --   --   --   HGB 11.6* 10.7* 10.7* 10.0*  HCT 33.9* 30.4* 32.3* 27.7*  MCV 98.3 99.0 100.3* 97.5  PLT 132* 114* 133* 100*   Basic Metabolic Panel: Recent Labs  Lab 04/06/20 0645 04/08/20 0324 04/08/20 1816 04/09/20 1018 04/09/20 1548  NA 135 137 135 134* 133*  K 3.2* 3.5 2.9* 2.7* 2.8*  CL 105 107 102 98 99  CO2 21* 22 24 27 26   GLUCOSE 83 127* 114* 103* 124*  BUN <5* <5* 4* 6 5*  CREATININE 0.70 0.78 0.79 0.72 0.70  CALCIUM 8.1* 8.5* 8.0* 8.2* 8.0*   GFR: Estimated Creatinine Clearance: 69.8 mL/min (by C-G formula based on SCr of 0.7 mg/dL). Liver Function Tests: Recent Labs  Lab 04/05/20 0731 04/08/20 0324 04/08/20 1816  AST 103* 89* 88*  ALT 32 35 31  ALKPHOS 129* 114 106  BILITOT 23.4* 20.7* 17.7*  PROT 6.4* 6.1* 5.5*  ALBUMIN 2.1* 1.8* 2.1*   Recent Labs  Lab 04/05/20 0731  LIPASE 30   Recent Labs  Lab 04/05/20 0731  AMMONIA 70*   Coagulation Profile: Recent Labs  Lab 04/05/20 0731 04/08/20 0324  INR 1.7* 1.8*   Cardiac Enzymes: No results for input(s): CKTOTAL, CKMB, CKMBINDEX, TROPONINI in the last 168 hours. BNP (last 3 results) No results for input(s): PROBNP in the last 8760 hours. HbA1C: No results for input(s): HGBA1C in the last 72 hours. CBG: Recent Labs  Lab 04/05/20 1749  GLUCAP 80   Lipid Profile: No results for input(s): CHOL, HDL, LDLCALC, TRIG, CHOLHDL, LDLDIRECT in the last 72 hours. Thyroid Function Tests: No results for input(s): TSH, T4TOTAL, FREET4, T3FREE, THYROIDAB in the last 72 hours. Anemia Panel: No results for input(s): VITAMINB12, FOLATE, FERRITIN, TIBC, IRON, RETICCTPCT in the last 72 hours. Sepsis Labs: No results for input(s): PROCALCITON, LATICACIDVEN in the last 168 hours.  Recent  Results (from the past 240 hour(s))  Gram stain     Status: None   Collection Time: 04/05/20 10:36 AM   Specimen: Peritoneal Washings  Result Value Ref Range Status   Specimen Description PERITONEAL  Final   Special Requests FLUID  Final   Gram Stain   Final    WBC PRESENT, PREDOMINANTLY MONONUCLEAR NO ORGANISMS SEEN CYTOSPIN SMEAR Performed at [facility], [address] [place], [place]    Report Status 04/05/2020 FINAL  Final  Culture, body fluid-bottle     Status: None (Preliminary result)   Collection Time: 04/05/20 10:36 AM   Specimen: Peritoneal Washings  Result Value Ref Range Status   Specimen Description PERITONEAL  Final   Special Requests FLUID  Final   Culture   Final    NO GROWTH 4 DAYS Performed at [facility], [address] [place], [place]  Report Status PENDING  Incomplete  Resp Panel by RT-PCR (Flu A&B, Covid) Nasopharyngeal Swab     Status: None   Collection Time: 04/05/20  2:11 PM   Specimen: Nasopharyngeal Swab; Nasopharyngeal(NP) swabs in vial transport medium  Result Value Ref Range Status   SARS Coronavirus 2 by RT PCR NEGATIVE NEGATIVE Final    Comment: (NOTE) SARS-CoV-2 target nucleic acids are NOT DETECTED.  The SARS-CoV-2 RNA is generally detectable in upper respiratory specimens during the acute phase of infection. The lowest concentration of SARS-CoV-2 viral copies this assay can detect is 138 copies/mL. A negative result does not preclude SARS-Cov-2 infection and should not be used as the sole basis for treatment or other patient management decisions. A negative result may occur with  improper specimen collection/handling, submission of specimen other than nasopharyngeal swab, presence of viral mutation(s) within the areas targeted by this assay, and inadequate number of viral copies(<138 copies/mL). A negative result must be combined with clinical observations, patient history, and  epidemiological information. The expected result is Negative.  Fact Sheet for Patients:  EntrepreneurPulse.com.au  Fact Sheet for Healthcare Providers:  IncredibleEmployment.be  This test is no t yet approved or cleared by the [place] FDA and  has been authorized for detection and/or diagnosis of SARS-CoV-2 by FDA under an Emergency Use Authorization (EUA). This EUA will remain  in effect (meaning this test can be used) for the duration of the COVID-19 declaration under Section 564(b)(1) of the Act, 21 U.S.C.section 360bbb-3(b)(1), unless the authorization is terminated  or revoked sooner.       Influenza A by PCR NEGATIVE NEGATIVE Final   Influenza B by PCR NEGATIVE NEGATIVE Final    Comment: (NOTE) The Xpert Xpress SARS-CoV-2/FLU/RSV plus assay is intended as an aid in the diagnosis of influenza from Nasopharyngeal swab specimens and should not be used as a sole basis for treatment. Nasal washings and aspirates are unacceptable for Xpert Xpress SARS-CoV-2/FLU/RSV testing.  Fact Sheet for Patients: EntrepreneurPulse.com.au  Fact Sheet for Healthcare Providers: IncredibleEmployment.be  This test is not yet approved or cleared by the [place] FDA and has been authorized for detection and/or diagnosis of SARS-CoV-2 by FDA under an Emergency Use Authorization (EUA). This EUA will remain in effect (meaning this test can be used) for the duration of the COVID-19 declaration under Section 564(b)(1) of the Act, 21 U.S.C. section 360bbb-3(b)(1), unless the authorization is terminated or revoked.  Performed at [facility], [place] [address], [place],  [address]          Radiology Studies: IR Paracentesis  Result Date: 04/08/2020 INDICATION: Patient with a history of alcoholic cirrhosis and recurrent ascites. Interventional Radiology asked to perform a therapeutic paracentesis.  EXAM: ULTRASOUND GUIDED PARACENTESIS MEDICATIONS: 1% lidocaine 10 mL COMPLICATIONS: None immediate PROCEDURE: Informed written consent was obtained from the patient after a discussion of the risks, benefits and alternatives to treatment. A timeout was performed prior to the initiation of the procedure. Initial ultrasound scanning demonstrates a large amount of ascites within the left lower abdominal quadrant. The left lower abdomen was prepped and draped in the usual sterile fashion. 1% lidocaine was used for local anesthesia. Following this, a 19 gauge, 7-cm, Yueh catheter was introduced. An ultrasound image was saved for documentation purposes. The paracentesis was performed. The catheter was removed and a dressing was applied. The patient tolerated the procedure well without immediate post procedural complication. FINDINGS: A total of approximately 1.6 L of clear yellow fluid was removed.  IMPRESSION: Successful ultrasound-guided paracentesis yielding 1.6 liters of peritoneal fluid. Read by: Soyla Dryer, NP Electronically Signed   By: Jacqulynn Cadet M.D.   On: 04/08/2020 16:52        Scheduled Meds: . (feeding supplement) PROSource Plus  30 mL Oral BID BM  . docusate sodium  100 mg Oral BID  . folic acid  1 mg Oral Daily  . furosemide  20 mg Oral Daily  . HYDROmorphone  2 mg Oral Q8H  . iron polysaccharides  150 mg Oral QODAY  . midodrine  10 mg Oral TID PC  . multivitamin with minerals  1 tablet Oral Daily  . nicotine  7 mg Transdermal Daily  . pantoprazole  40 mg Oral Q0600  . potassium chloride  40 mEq Oral Q4H  . spironolactone  50 mg Oral Daily  . thiamine  100 mg Oral Daily   Continuous Infusions:   LOS: 3 days    Time spent: 35 minutes    Langdon Crosson A Gerlene Glassburn, MD [facility]   If 7PM-7AM, please contact night-coverage [email or web address]  04/09/2020, 5:15 PM

## 2020-04-09 NOTE — Plan of Care (Signed)

## 2020-04-09 NOTE — Care Management Important Message (Signed)
Important Message  Patient Details  Name: Rachel Fuller MRN: [id number] Date of Birth: 1980/12/22   Medicare Important Message Given:  Yes     Chenoah Mcnally Stefan Church 04/09/2020, 3:58 PM

## 2020-04-09 NOTE — Progress Notes (Signed)
Patient underwent paracentesis in IR 04/08/20. During the procedure it is possible that a small portion of bowel touched the catheter during the drainage process with negative pressure technique. Patient stated she is having a little abdominal discomfort today and wished to speak to IR. I called and spoke with the bedside RN and the patient to explain that it is a normal occurrence for bowel to touch the catheter during the paracentesis as the fluid is being withdrawn. The patient has stable vitals and labs and she states she feels ready to go home. She denies any significant pain or discomfort.   The patient was satisfied with my explanation of the procedure and had no further questions.  Alwyn Ren, AGACNP-BC 04/09/2020, 10:57 AM

## 2020-04-10 ENCOUNTER — Other Ambulatory Visit (HOSPITAL_COMMUNITY): Payer: Self-pay | Admitting: Internal Medicine

## 2020-04-10 DIAGNOSIS — K7210 Chronic hepatic failure without coma: Secondary | ICD-10-CM | POA: Diagnosis not present

## 2020-04-10 LAB — CULTURE, BODY FLUID W GRAM STAIN -BOTTLE: Culture: NO GROWTH

## 2020-04-10 LAB — BASIC METABOLIC PANEL
Anion gap: 8 (ref 5–15)
BUN: 6 mg/dL (ref 6–20)
CO2: 27 mmol/L (ref 22–32)
Calcium: 8.3 mg/dL — ABNORMAL LOW (ref 8.9–10.3)
Chloride: 100 mmol/L (ref 98–111)
Creatinine, Ser: 0.64 mg/dL (ref 0.44–1.00)
GFR, Estimated: >60 mL/min (ref >60)
Glucose, Bld: 105 mg/dL — ABNORMAL HIGH (ref 70–99)
Potassium: 3.9 mmol/L (ref 3.5–5.1)
Sodium: 135 mmol/L (ref 135–145)

## 2020-04-10 LAB — AFP TUMOR MARKER: AFP, Serum, Tumor Marker: 2.9 ng/mL (ref 0.0–8.3)

## 2020-04-10 MED ORDER — HYDROMORPHONE HCL 2 MG PO TABS: 2.0000 mg | ORAL_TABLET | Freq: Four times a day (QID) | ORAL | 0 refills | Status: DC | PRN

## 2020-04-10 MED ORDER — POTASSIUM CHLORIDE CRYS ER 20 MEQ PO TBCR
20.0000 meq | EXTENDED_RELEASE_TABLET | Freq: Every day | ORAL | Status: DC
  Administered 2020-04-10: 20 meq via ORAL
  Filled 2020-04-10: qty 1

## 2020-04-10 MED ORDER — DOCUSATE SODIUM 100 MG PO CAPS: 100.0000 mg | ORAL_CAPSULE | Freq: Two times a day (BID) | ORAL | 0 refills | Status: DC

## 2020-04-10 MED ORDER — NICOTINE 7 MG/24HR TD PT24: 7.0000 mg | MEDICATED_PATCH | Freq: Every day | TRANSDERMAL | 0 refills | Status: DC

## 2020-04-10 MED ORDER — ADULT MULTIVITAMIN W/MINERALS CH: 1.0000 | ORAL_TABLET | Freq: Every day | ORAL | 0 refills | Status: AC

## 2020-04-10 MED ORDER — POTASSIUM CHLORIDE CRYS ER 20 MEQ PO TBCR: 20.0000 meq | EXTENDED_RELEASE_TABLET | Freq: Every day | ORAL | 0 refills | Status: DC

## 2020-04-10 MED ORDER — FUROSEMIDE 20 MG PO TABS: 20.0000 mg | ORAL_TABLET | Freq: Every day | ORAL | 3 refills | Status: DC

## 2020-04-10 MED ORDER — PANTOPRAZOLE SODIUM 40 MG PO TBEC: 40.0000 mg | DELAYED_RELEASE_TABLET | Freq: Every day | ORAL | 1 refills | Status: DC

## 2020-04-10 MED ORDER — MIDODRINE HCL 10 MG PO TABS: 10.0000 mg | ORAL_TABLET | Freq: Three times a day (TID) | ORAL | 0 refills | Status: AC

## 2020-04-10 MED ORDER — SPIRONOLACTONE 50 MG PO TABS: 50.0000 mg | ORAL_TABLET | Freq: Every day | ORAL | 3 refills | Status: DC

## 2020-04-10 MED FILL — PANTOPRAZOLE SOD DR 40 MG T: 40 | 30 days supply | Qty: 30 | Fill #0

## 2020-04-10 MED FILL — POTASSIUM CHLORIDE 20meqER: 20 | 10 days supply | Qty: 10 | Fill #0

## 2020-04-10 MED FILL — FUROSEMIDE 20 MG TAB: 20 | 30 days supply | Qty: 30 | Fill #0

## 2020-04-10 MED FILL — HYDROmorphone HCL 2 MG TABS: 2 | 5 days supply | Qty: 20 | Fill #0

## 2020-04-10 MED FILL — NICOTINE 7 MG/24HR PATCH: 7 | 28 days supply | Qty: 28 | Fill #0

## 2020-04-10 MED FILL — DOCUSATE SODIUM 100 MG CAPS: 100 | 5 days supply | Qty: 10 | Fill #0

## 2020-04-10 MED FILL — SPIRONOLACTONE 50 MG TAB: 50 | 30 days supply | Qty: 30 | Fill #0

## 2020-04-10 NOTE — Progress Notes (Signed)
DISCHARGE NOTE HOME Rachel Fuller to be discharged home per MD order. Discussed prescriptions and follow up appointments with the patient. Prescriptions given to patient; medication list explained in detail. Patient verbalized understanding.  Skin clean, dry and intact without evidence of skin break down, no evidence of skin tears noted. IV catheter discontinued intact. Site without signs and symptoms of complications. Dressing and pressure applied. Pt denies pain at the site currently. No complaints noted.  Patient free of lines, drains, and wounds.   An After Visit Summary (AVS) was printed and given to the patient. Patient escorted via wheelchair, and discharged home via private auto.  Caylea Foronda S Michela Herst, RN

## 2020-04-10 NOTE — Discharge Summary (Signed)
Physician Discharge Summary  Rachel Fuller NFA:[id number] DOB: 1980-09-19 DOA: 04/05/2020  PCP: [facility]  Admit date: 04/05/2020 Discharge date: 04/10/2020  Admitted From: Home  Disposition: Home   Recommendations for Outpatient Follow-up:  1. Follow up with PCP in 1-2 weeks 2. Please obtain BMP/CBC in one week 3. Needs to follow up with Liver transplant team at [place].  4. Needs labs to follow K and renal function.   Home Health: none  Discharge Condition: Stable.  CODE STATUS: Full code Diet recommendation: Heart Healthy  Brief/Interim Summary: 39 year old with past medical history significant for TBI, PCKD, A. 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  Of note patient was recently discharged from [facility] 12/12 until 12/24 for hematemesis.  Had esophageal varices banded. She has an appointment scheduled with hepatology transplant [facility]  on January 4.   1-End-Stage Liver Disease: Patient presented with abdominal pain. She was just discharged from [facility] on 12[facility] with plan for outpatient hematology follow-up in [place] January 4 for pretransplant evaluation Reported alcohol cessation in 12/2018. Had a CT abdomen finding consistent with cirrhosis.  GI recommended paracentesis. #1 paracentesis on 12/28.  Received Albumin. Continue with Lasix and spironolactone Stable for discharge.   2-Hypokalemia: replete with oral potasium Mg normal.  Resolved.  Discharge on potasium supplement for few days.   3-Hypotension: Continue with midodrine  COVID-19 -Positive test on 12/13 but no symptoms during last hospitalization -She is vaccinated  Covid negative  History of colon cancer reported in remission 09/29/2017 Alcoholic  Cardiomyopathy: AICD in place, had cardiac arrest 7/29 Prior ejection fraction 25 to 30% but this has recovered ejection fraction 60%    Discharge Diagnoses:  Principal Problem:   End stage liver disease (HCC) Active Problems:   Decompensated hepatic cirrhosis (HCC)   Failure to thrive in adult   Hypotension, chronic   Abdominal pain   Other ascites   Malnutrition of moderate degree    Discharge Instructions  Discharge Instructions    Diet - low sodium heart healthy   Complete by: As directed    Increase activity slowly   Complete by: As directed      Allergies as of 04/10/2020      Reactions   Bee Pollen Anaphylaxis   Clonidine Derivatives Shortness Of Breath, Rash   Gabapentin Rash, Other (See Comments)   Pt felt weird   Ivp Dye [iodinated Diagnostic Agents] Anaphylaxis   Shellfish-derived Products Anaphylaxis   Doxycycline Hives   Naprosyn [naproxen] Nausea And Vomiting   Earnestine Leys Officinalis] Swelling   Sulfa Antibiotics Other (See Comments)   Respiratory distress   Tylenol [acetaminophen] Other (See Comments)   Liver failure   Motrin [ibuprofen] Other (See Comments)   Liver failure   Promethazine Rash      Medication List    TAKE these medications   docusate sodium 100 MG capsule Commonly known as: COLACE Take 1 capsule (100 mg total) by mouth 2 (two) times daily.   folic acid 1 MG tablet Commonly known as: FOLVITE Take 1 mg by mouth daily.   furosemide 20 MG tablet Commonly known as: LASIX Take 1 tablet (20 mg total) by mouth daily. Start taking on: April 11, 2020   HYDROmorphone 2 MG tablet Commonly known as: DILAUDID Take 1 tablet (2 mg total) by mouth every 6 (  six) hours as needed for up to 5 days for severe pain.   iron polysaccharides 150 MG capsule Commonly known as: NIFEREX Take 150 mg by mouth every other day.   melatonin 3 MG Tabs tablet Take 3 mg by mouth at bedtime as needed (insomnia).   midodrine 10 MG  tablet Commonly known as: PROAMATINE Take 1 tablet (10 mg total) by mouth 3 (three) times daily after meals.   multivitamin with minerals Tabs tablet Take 1 tablet by mouth daily. Start taking on: April 11, 2020   nicotine 7 mg/24hr patch Commonly known as: NICODERM CQ - dosed in mg/24 hr Place 1 patch (7 mg total) onto the skin daily. Start taking on: April 11, 2020   ondansetron 4 MG disintegrating tablet Commonly known as: ZOFRAN-ODT Take 4 mg by mouth every 8 (eight) hours as needed for nausea or vomiting.   pantoprazole 40 MG tablet Commonly known as: PROTONIX Take 1 tablet (40 mg total) by mouth daily at 6 (six) AM. Start taking on: April 11, 2020   potassium chloride SA 20 MEQ tablet Commonly known as: KLOR-CON Take 1 tablet (20 mEq total) by mouth daily. Start taking on: April 11, 2020   spironolactone 50 MG tablet Commonly known as: ALDACTONE Take 1 tablet (50 mg total) by mouth daily. Start taking on: April 11, 2020   thiamine 100 MG tablet Take 100 mg by mouth daily.       Follow-up Information    Cornella, Loreen Freud, MD. Call.   Specialty: Gastroenterology Why: contact office for any immediate needs in terms of ascites.   Contact information: [place] [address] [phone]        [place], [place] Follow up in 1 week(s).   Specialty: Internal Medicine Contact information: [address] [place] [address] [phone]              Allergies  Allergen Reactions  . Bee Pollen Anaphylaxis  . Clonidine Derivatives Shortness Of Breath and Rash  . Gabapentin Rash and Other (See Comments)    Pt felt weird  . Ivp Dye [Iodinated Diagnostic Agents] Anaphylaxis  . Shellfish-Derived Products Anaphylaxis  . Doxycycline Hives  . Naprosyn [Naproxen] Nausea And Vomiting  . Thelma Barge Officinalis] Swelling  . Sulfa Antibiotics Other (See Comments)    Respiratory distress  . Tylenol  [Acetaminophen] Other (See Comments)    Liver failure  . Motrin [Ibuprofen] Other (See Comments)    Liver failure  . Promethazine Rash    Consultations:  GI   Procedures/Studies: CT ABDOMEN PELVIS W CONTRAST  Result Date: 04/07/2020 CLINICAL DATA:  Diffuse abdominal pain for several days. EXAM: CT ABDOMEN AND PELVIS WITH CONTRAST TECHNIQUE: Multidetector CT imaging of the abdomen and pelvis was performed using the standard protocol following bolus administration of intravenous contrast. CONTRAST:  183mL OMNIPAQUE IOHEXOL 300 MG/ML  SOLN COMPARISON:  01/27/2018 FINDINGS: Lower chest: Trace bilateral pleural effusions with overlying areas of mild platelike atelectasis. Hepatobiliary: Diffuse hepatic steatosis with morphologic changes of cirrhosis. There is a suspicion of a focal low-attenuation structure within the inferior right hepatic lobe measuring 3.1 cm, image 26/3 and image 73/6. gallbladder wall appears diffusely edematous, likely reflecting portal venous hypertension. No biliary ductal dilatation. Pancreas: Unremarkable. No pancreatic ductal dilatation or surrounding inflammatory changes. Spleen: Measures 8.6 x 5.2 by 11.2 cm (volume = 260 cm^3) Adrenals/Urinary Tract: Normal adrenal glands. No kidney mass or hydronephrosis identified. Urinary bladder is unremarkable.  Stomach/Bowel: Stomach is nondistended. No bowel wall thickening, inflammation or distension. Vascular/Lymphatic: Normal appearance of the abdominal aorta. No aneurysm. Portal vein appears increased in caliber measuring 1.6 cm. Upper abdominal varices including recanalization of the umbilical vein. No abdominopelvic adenopathy. Reproductive: Uterus and bilateral adnexa are unremarkable. Other: Large volume of ascites is identified within the abdomen and pelvis. This is new when compared with 01/27/2018. Musculoskeletal: No acute or significant osseous findings. IMPRESSION: 1. Morphologic features of the liver compatible with  cirrhosis. Stigmata of portal venous hypertension is noted including large volume of ascites and upper abdominal varices. The spleen has a normal size. 2. There is a suspicion of a focal low-attenuation structure within the inferior right hepatic lobe measuring 3.1 cm. When the patient is clinically stable and able to follow directions and hold their breath (preferably as an outpatient) further evaluation with dedicated abdominal MRI should be considered. 3. Trace bilateral pleural effusions. Electronically Signed   By: Kerby Moors M.D.   On: 04/07/2020 11:49   US Paracentesis  Result Date: 04/05/2020 INDICATION: Recurrent ascites secondary to alcoholic cirrhosis. Request for diagnostic and therapeutic paracentesis. EXAM: ULTRASOUND GUIDED PARACENTESIS MEDICATIONS: 1% lidocaine 10 mL COMPLICATIONS: None immediate. PROCEDURE: Informed written consent was obtained from the patient after a discussion of the risks, benefits and alternatives to treatment. A timeout was performed prior to the initiation of the procedure. Initial ultrasound scanning demonstrates a moderate amount of ascites within the left lateral abdomen quadrant. The left lateral abdomen was prepped and draped in the usual sterile fashion. 1% lidocaine was used for local anesthesia. Following this, a 19 gauge, 7-cm, Yueh catheter was introduced. An ultrasound image was saved for documentation purposes. The paracentesis was performed. The catheter was removed and a dressing was applied. The patient tolerated the procedure well without immediate post procedural complication. FINDINGS: A total of approximately 2.5 L of clear yellow fluid was removed. Samples were sent to the laboratory as requested by the clinical team. IMPRESSION: Successful ultrasound-guided paracentesis yielding 2.5 liters of peritoneal fluid. Read by: Gareth Eagle, PA-C Electronically Signed   By: Markus Daft M.D.   On: 04/05/2020 10:59   IR Paracentesis  Result Date:  04/08/2020 INDICATION: Patient with a history of alcoholic cirrhosis and recurrent ascites. Interventional Radiology asked to perform a therapeutic paracentesis. EXAM: ULTRASOUND GUIDED PARACENTESIS MEDICATIONS: 1% lidocaine 10 mL COMPLICATIONS: None immediate PROCEDURE: Informed written consent was obtained from the patient after a discussion of the risks, benefits and alternatives to treatment. A timeout was performed prior to the initiation of the procedure. Initial ultrasound scanning demonstrates a large amount of ascites within the left lower abdominal quadrant. The left lower abdomen was prepped and draped in the usual sterile fashion. 1% lidocaine was used for local anesthesia. Following this, a 19 gauge, 7-cm, Yueh catheter was introduced. An ultrasound image was saved for documentation purposes. The paracentesis was performed. The catheter was removed and a dressing was applied. The patient tolerated the procedure well without immediate post procedural complication. FINDINGS: A total of approximately 1.6 L of clear yellow fluid was removed. IMPRESSION: Successful ultrasound-guided paracentesis yielding 1.6 liters of peritoneal fluid. Read by: Soyla Dryer, NP Electronically Signed   By: Jacqulynn Cadet M.D.   On: 04/08/2020 16:52      Subjective: Abdominal pain stable.   Discharge Exam: Vitals:   04/10/20 0505 04/10/20 0933  BP: 100/73 102/70  Pulse: 65 72  Resp: 18 14  Temp: 98.3 F (36.8 C) 98.9 F (37.2 C)  SpO2: 100% 99%     General: Pt is alert, awake, not in acute distress Cardiovascular: RRR, S1/S2 +, no rubs, no gallops Respiratory: CTA bilaterally, no wheezing, no rhonchi Abdominal: Soft, NT, ND, bowel sounds + Extremities: no edema, no cyanosis    The results of significant diagnostics from this hospitalization (including imaging, microbiology, ancillary and laboratory) are listed below for reference.     Microbiology: Recent Results (from the past 240  hour(s))  Gram stain     Status: None   Collection Time: 04/05/20 10:36 AM   Specimen: Peritoneal Washings  Result Value Ref Range Status   Specimen Description PERITONEAL  Final   Special Requests FLUID  Final   Gram Stain   Final    WBC PRESENT, PREDOMINANTLY MONONUCLEAR NO ORGANISMS SEEN CYTOSPIN SMEAR Performed at [facility], [address] [place], [place] [address]    Report Status 04/05/2020 FINAL  Final  Culture, body fluid-bottle     Status: None   Collection Time: 04/05/20 10:36 AM   Specimen: Peritoneal Washings  Result Value Ref Range Status   Specimen Description PERITONEAL  Final   Special Requests FLUID  Final   Culture   Final    NO GROWTH 5 DAYS Performed at [facility], [address], [place], [place] [address]    Report Status 04/10/2020 FINAL  Final  Resp Panel by RT-PCR (Flu A&B, Covid) Nasopharyngeal Swab     Status: None   Collection Time: 04/05/20  2:11 PM   Specimen: Nasopharyngeal Swab; Nasopharyngeal(NP) swabs in vial transport medium  Result Value Ref Range Status   SARS Coronavirus 2 by RT PCR NEGATIVE NEGATIVE Final    Comment: (NOTE) SARS-CoV-2 target nucleic acids are NOT DETECTED.  The SARS-CoV-2 RNA is generally detectable in upper respiratory specimens during the acute phase of infection. The lowest concentration of SARS-CoV-2 viral copies this assay can detect is 138 copies/mL. A negative result does not preclude SARS-Cov-2 infection and should not be used as the sole basis for treatment or other patient management decisions. A negative result may occur with  improper specimen collection/handling, submission of specimen other than nasopharyngeal swab, presence of viral mutation(s) within the areas targeted by this assay, and inadequate number of viral copies(<138 copies/mL). A negative result must be combined with clinical observations, patient history, and epidemiological information. The expected result is  Negative.  Fact Sheet for Patients:  EntrepreneurPulse.com.au  Fact Sheet for Healthcare Providers:  IncredibleEmployment.be  This test is no t yet approved or cleared by the [place] FDA and  has been authorized for detection and/or diagnosis of SARS-CoV-2 by FDA under an Emergency Use Authorization (EUA). This EUA will remain  in effect (meaning this test can be used) for the duration of the COVID-19 declaration under Section 564(b)(1) of the Act, 21 U.S.C.section 360bbb-3(b)(1), unless the authorization is terminated  or revoked sooner.       Influenza A by PCR NEGATIVE NEGATIVE Final   Influenza B by PCR NEGATIVE NEGATIVE Final    Comment: (NOTE) The Xpert Xpress SARS-CoV-2/FLU/RSV plus assay is intended as an aid in the diagnosis of influenza from Nasopharyngeal swab specimens and should not be used as a sole basis for treatment. Nasal washings and aspirates are unacceptable for Xpert Xpress SARS-CoV-2/FLU/RSV testing.  Fact Sheet for Patients: EntrepreneurPulse.com.au  Fact Sheet for Healthcare Providers: IncredibleEmployment.be  This test is not yet approved or cleared by the [place] FDA and has been authorized for detection  and/or diagnosis of SARS-CoV-2 by FDA under an Emergency Use Authorization (EUA). This EUA will remain in effect (meaning this test can be used) for the duration of the COVID-19 declaration under Section 564(b)(1) of the Act, 21 U.S.C. section 360bbb-3(b)(1), unless the authorization is terminated or revoked.  Performed at [facility], [place] [address], [place], [place] [address]      Labs: BNP (last 3 results) No results for input(s): BNP in the last 8760 hours. Basic Metabolic Panel: Recent Labs  Lab 04/08/20 0324 04/08/20 1816 04/09/20 1018 04/09/20 1548 04/10/20 0009  NA 137 135 134* 133* 135  K 3.5 2.9* 2.7* 2.8* 3.9  CL 107 102 98 99 100   CO2 22 24 27 26 27   GLUCOSE 127* 114* 103* 124* 105*  BUN <5* 4* 6 5* 6  CREATININE 0.78 0.79 0.72 0.70 0.64  CALCIUM 8.5* 8.0* 8.2* 8.0* 8.3*  MG  --   --   --  1.9  --    Liver Function Tests: Recent Labs  Lab 04/05/20 0731 04/08/20 0324 04/08/20 1816  AST 103* 89* 88*  ALT 32 35 31  ALKPHOS 129* 114 106  BILITOT 23.4* 20.7* 17.7*  PROT 6.4* 6.1* 5.5*  ALBUMIN 2.1* 1.8* 2.1*   Recent Labs  Lab 04/05/20 0731  LIPASE 30   Recent Labs  Lab 04/05/20 0731  AMMONIA 70*   CBC: Recent Labs  Lab 04/05/20 0731 04/06/20 0645 04/08/20 0324 04/09/20 0418  WBC 10.0 8.3 12.8* 7.6  NEUTROABS 9.0*  --   --   --   HGB 11.6* 10.7* 10.7* 10.0*  HCT 33.9* 30.4* 32.3* 27.7*  MCV 98.3 99.0 100.3* 97.5  PLT 132* 114* 133* 100*   Cardiac Enzymes: No results for input(s): CKTOTAL, CKMB, CKMBINDEX, TROPONINI in the last 168 hours. BNP: Invalid input(s): POCBNP CBG: Recent Labs  Lab 04/05/20 1749  GLUCAP 80   D-Dimer No results for input(s): DDIMER in the last 72 hours. Hgb A1c No results for input(s): HGBA1C in the last 72 hours. Lipid Profile No results for input(s): CHOL, HDL, LDLCALC, TRIG, CHOLHDL, LDLDIRECT in the last 72 hours. Thyroid function studies No results for input(s): TSH, T4TOTAL, T3FREE, THYROIDAB in the last 72 hours.  Invalid input(s): FREET3 Anemia work up No results for input(s): VITAMINB12, FOLATE, FERRITIN, TIBC, IRON, RETICCTPCT in the last 72 hours. Urinalysis    Component Value Date/Time   COLORURINE AMBER (A) 04/05/2020 1522   APPEARANCEUR HAZY (A) 04/05/2020 1522   LABSPEC 1.017 04/05/2020 1522   PHURINE 6.0 04/05/2020 1522   GLUCOSEU NEGATIVE 04/05/2020 1522   HGBUR NEGATIVE 04/05/2020 1522   BILIRUBINUR MODERATE (A) 04/05/2020 1522   KETONESUR NEGATIVE 04/05/2020 1522   PROTEINUR NEGATIVE 04/05/2020 1522   NITRITE NEGATIVE 04/05/2020 1522   LEUKOCYTESUR NEGATIVE 04/05/2020 1522   Sepsis Labs Invalid input(s): PROCALCITONIN,   WBC,  LACTICIDVEN Microbiology Recent Results (from the past 240 hour(s))  Gram stain     Status: None   Collection Time: 04/05/20 10:36 AM   Specimen: Peritoneal Washings  Result Value Ref Range Status   Specimen Description PERITONEAL  Final   Special Requests FLUID  Final   Gram Stain   Final    WBC PRESENT, PREDOMINANTLY MONONUCLEAR NO ORGANISMS SEEN CYTOSPIN SMEAR Performed at [facility], [place] [address], [place], [place] [address]    Report Status 04/05/2020 FINAL  Final  Culture, body fluid-bottle     Status: None   Collection Time: 04/05/20 10:36  AM   Specimen: Peritoneal Washings  Result Value Ref Range Status   Specimen Description PERITONEAL  Final   Special Requests FLUID  Final   Culture   Final    NO GROWTH 5 DAYS Performed at [place] [address], [place], [place] [address]    Report Status 04/10/2020 FINAL  Final  Resp Panel by RT-PCR (Flu A&B, Covid) Nasopharyngeal Swab     Status: None   Collection Time: 04/05/20  2:11 PM   Specimen: Nasopharyngeal Swab; Nasopharyngeal(NP) swabs in vial transport medium  Result Value Ref Range Status   SARS Coronavirus 2 by RT PCR NEGATIVE NEGATIVE Final    Comment: (NOTE) SARS-CoV-2 target nucleic acids are NOT DETECTED.  The SARS-CoV-2 RNA is generally detectable in upper respiratory specimens during the acute phase of infection. The lowest concentration of SARS-CoV-2 viral copies this assay can detect is 138 copies/mL. A negative result does not preclude SARS-Cov-2 infection and should not be used as the sole basis for treatment or other patient management decisions. A negative result may occur with  improper specimen collection/handling, submission of specimen other than nasopharyngeal swab, presence of viral mutation(s) within the areas targeted by this assay, and inadequate number of viral copies(<138 copies/mL). A negative result must be combined with clinical observations, patient history,  and epidemiological information. The expected result is Negative.  Fact Sheet for Patients:  EntrepreneurPulse.com.au  Fact Sheet for Healthcare Providers:  IncredibleEmployment.be  This test is no t yet approved or cleared by the [place] FDA and  has been authorized for detection and/or diagnosis of SARS-CoV-2 by FDA under an Emergency Use Authorization (EUA). This EUA will remain  in effect (meaning this test can be used) for the duration of the COVID-19 declaration under Section 564(b)(1) of the Act, 21 U.S.C.section 360bbb-3(b)(1), unless the authorization is terminated  or revoked sooner.       Influenza A by PCR NEGATIVE NEGATIVE Final   Influenza B by PCR NEGATIVE NEGATIVE Final    Comment: (NOTE) The Xpert Xpress SARS-CoV-2/FLU/RSV plus assay is intended as an aid in the diagnosis of influenza from Nasopharyngeal swab specimens and should not be used as a sole basis for treatment. Nasal washings and aspirates are unacceptable for Xpert Xpress SARS-CoV-2/FLU/RSV testing.  Fact Sheet for Patients: EntrepreneurPulse.com.au  Fact Sheet for Healthcare Providers: IncredibleEmployment.be  This test is not yet approved or cleared by the [place] FDA and has been authorized for detection and/or diagnosis of SARS-CoV-2 by FDA under an Emergency Use Authorization (EUA). This EUA will remain in effect (meaning this test can be used) for the duration of the COVID-19 declaration under Section 564(b)(1) of the Act, 21 U.S.C. section 360bbb-3(b)(1), unless the authorization is terminated or revoked.  Performed at [facility], [place] [address] [place], [place] [address]      Time coordinating discharge: 40 minutes  SIGNED:   Elmarie Shiley, MD  [facility]

## 2020-04-10 NOTE — Progress Notes (Signed)
Pt received discharge instructions and does not have any additional questions or concerns at this time. Pt is ready for discharge 

## 2020-11-10 DEATH — deceased

## 2021-07-02 IMAGING — CT CT ABD-PELV W/ CM
2 of 5 series · 16 of 46 positions shown, 18 images · IV contrast (Omni 300)
Comparison: 01/27/2018

CLINICAL DATA: Diffuse abdominal pain for several days.

EXAM:
CT ABDOMEN AND PELVIS WITH CONTRAST
TECHNIQUE: Multidetector CT imaging of the abdomen and pelvis was performed
using the standard protocol following bolus administration of
intravenous contrast.
CONTRAST:  100mL OMNIPAQUE IOHEXOL 300 MG/ML  SOLN

[Series 3: a/p w/ 5mm · axial · 0.76mm/px · z∈[+787,+1177]mm · 13 of 88 slices shown, 15 images]
[im 5/88  soft-tissue]
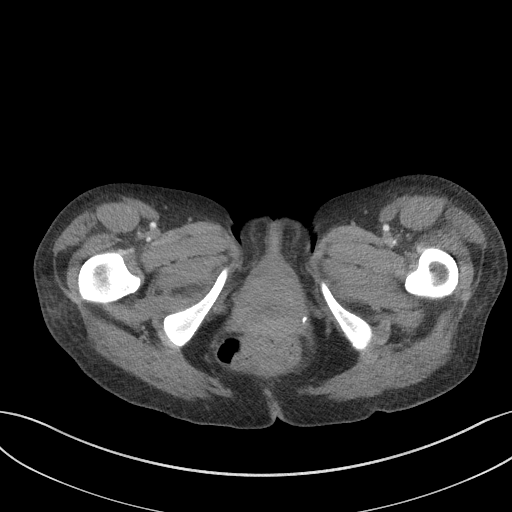
[im 5/88  bone]
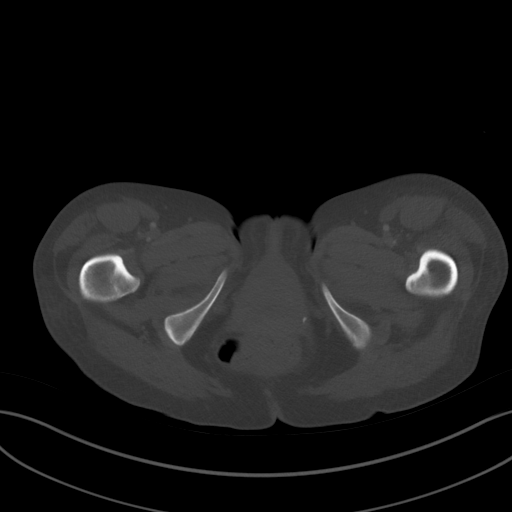
[im 14/88  soft-tissue]
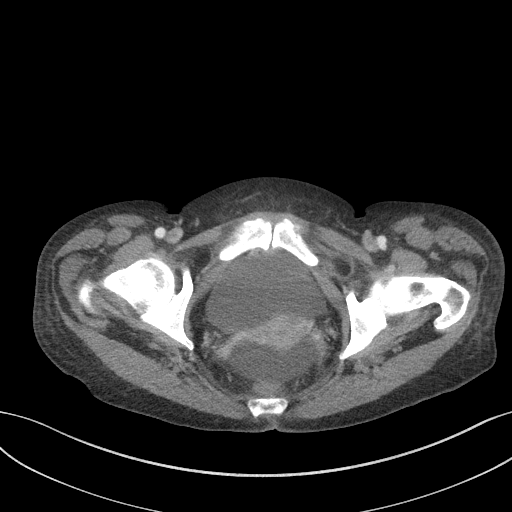
[im 19/88  soft-tissue]
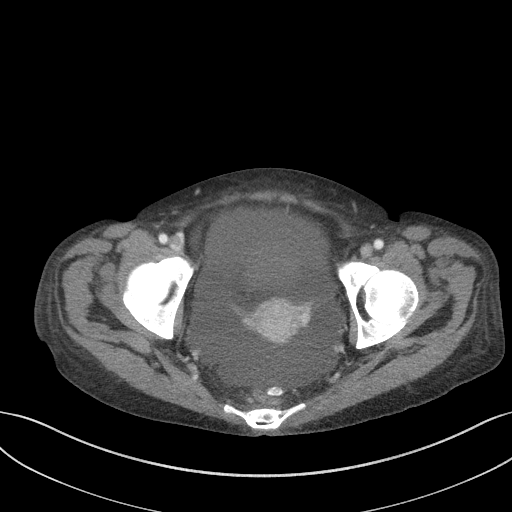
[im 23/88  soft-tissue]
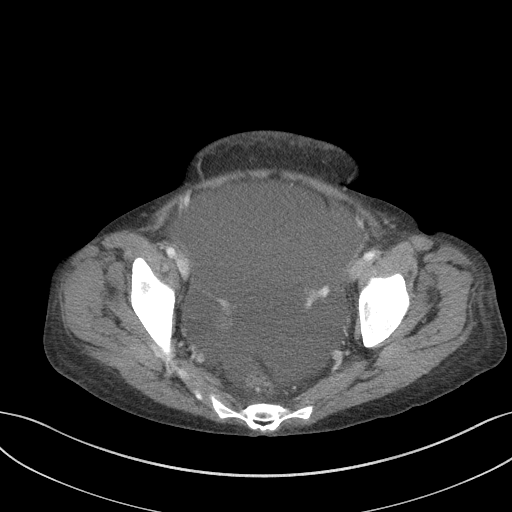
[im 33/88  soft-tissue]
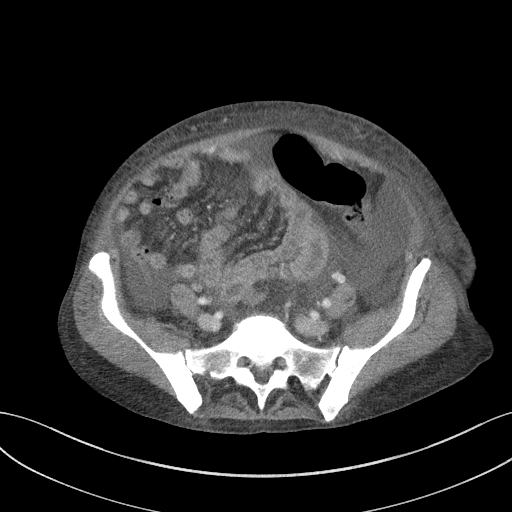
[im 37/88  soft-tissue]
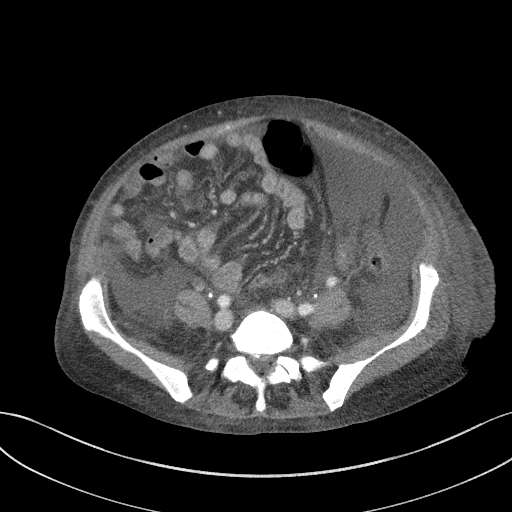
[im 46/88  soft-tissue]
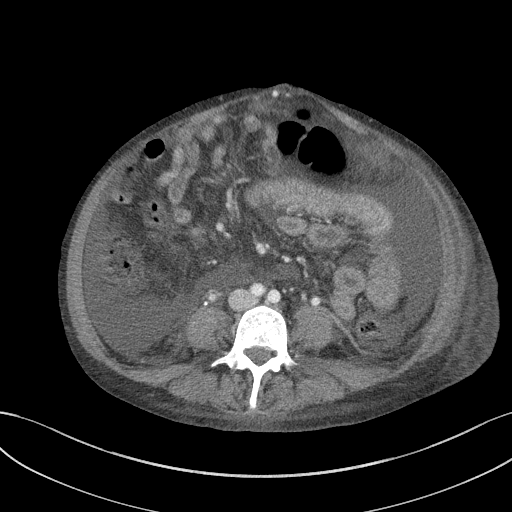
[im 51/88  soft-tissue]
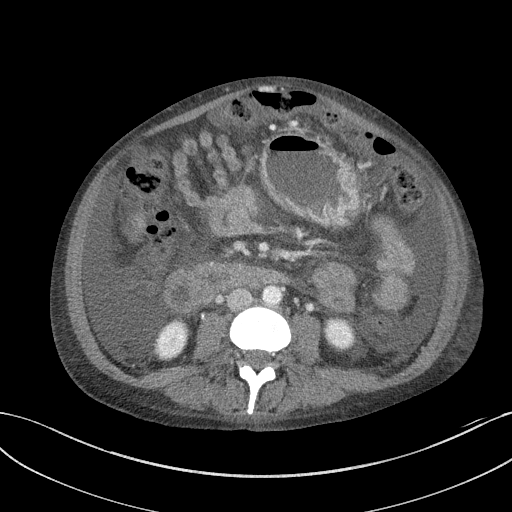
[im 55/88  soft-tissue]
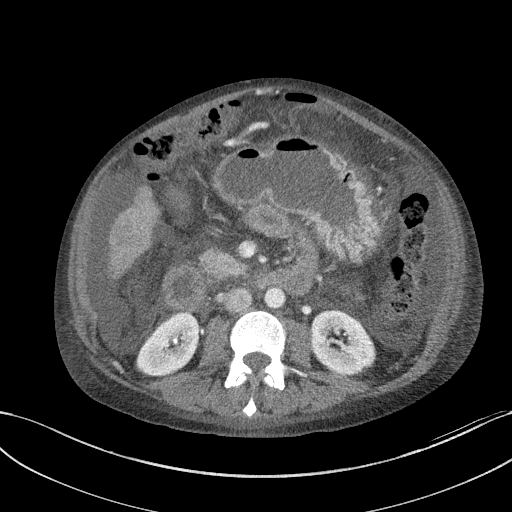
[im 55/88  bone]
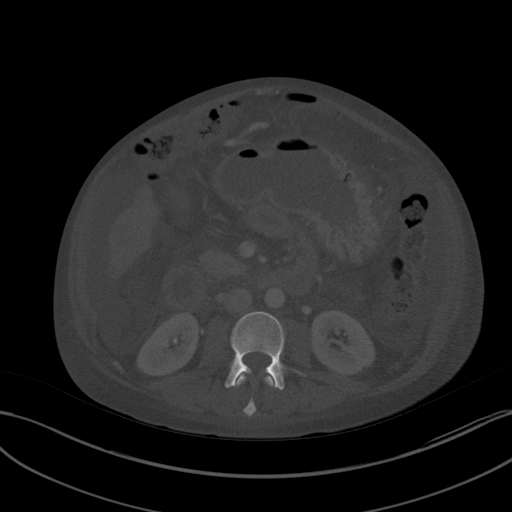
[im 65/88  soft-tissue]
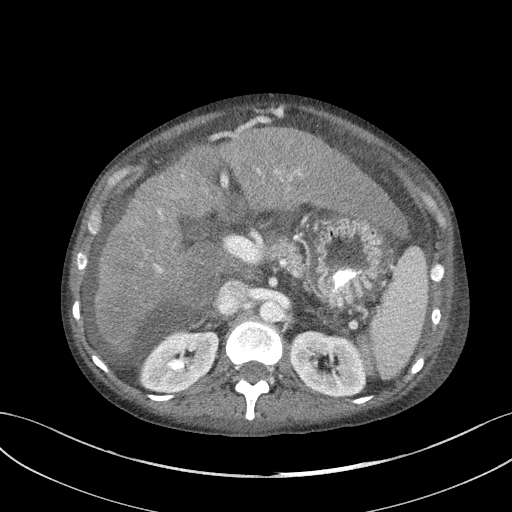
[im 69/88  soft-tissue]
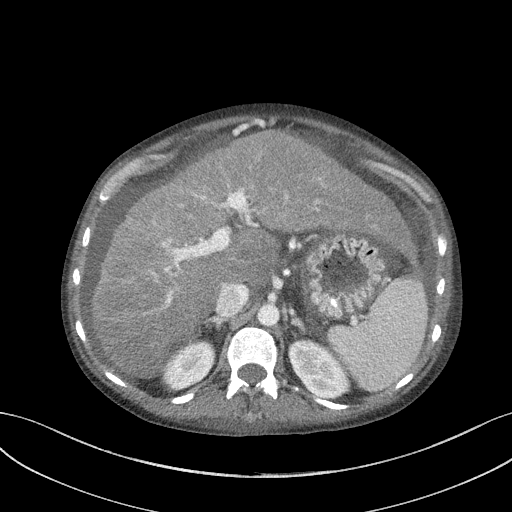
[im 74/88  soft-tissue]
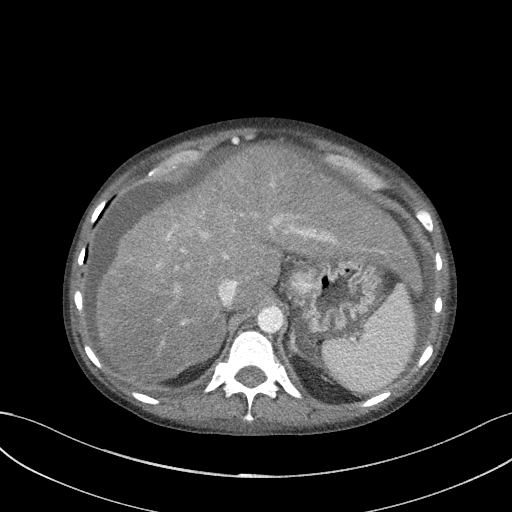
[im 83/88  soft-tissue]
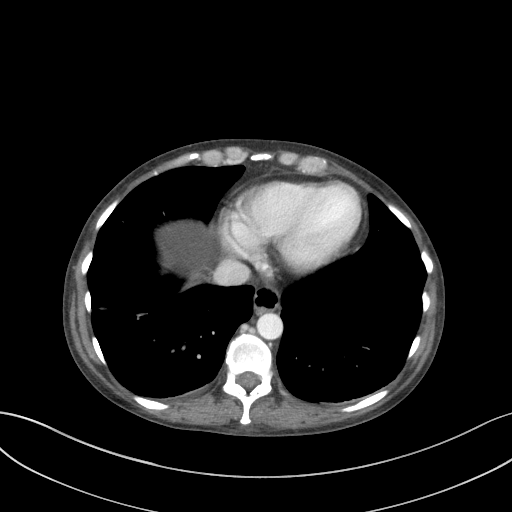

[Series 6: a/p w/ cor · coronal · 0.78mm/px · 3 of 129 slices shown]
[im 43/129  soft-tissue]
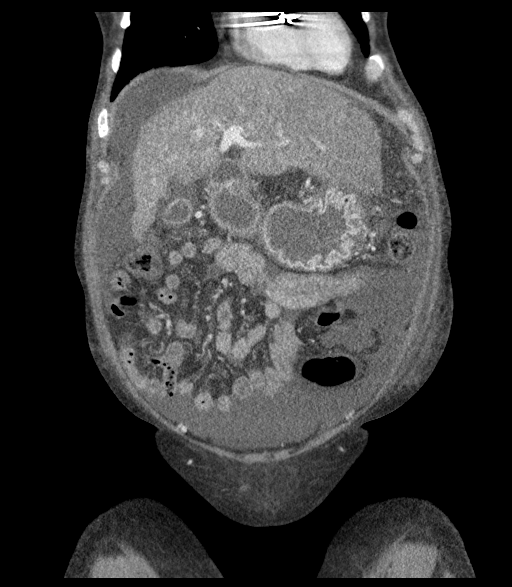
[im 57/129  soft-tissue]
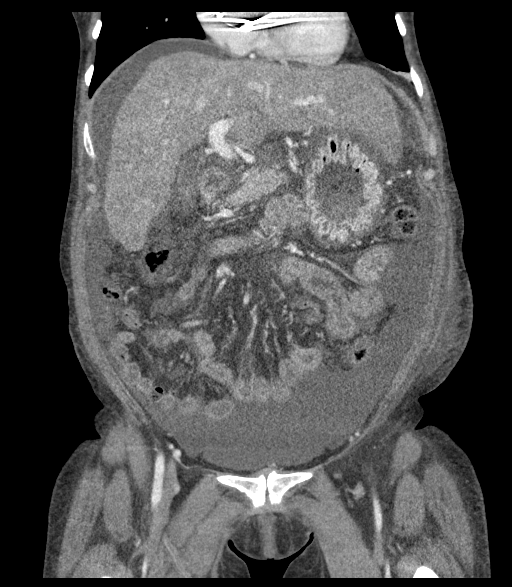
[im 72/129  soft-tissue]
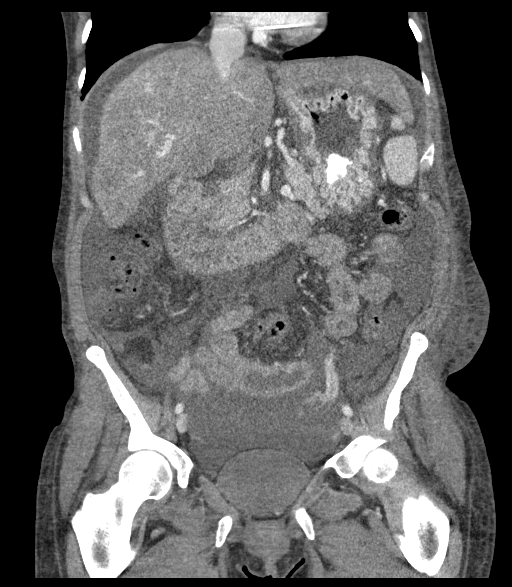

[16 of 46 positions shown; findings below may reference images not displayed]

FINDINGS: Lower chest: Trace bilateral pleural effusions with overlying areas
of mild platelike atelectasis.

Hepatobiliary: Diffuse hepatic steatosis with morphologic changes of
cirrhosis. There is a suspicion of a focal low-attenuation structure
within the inferior right hepatic lobe measuring 3.1 cm, image [DATE]
and image 73/6. gallbladder wall appears diffusely edematous, likely
reflecting portal venous hypertension. No biliary ductal dilatation.

Pancreas: Unremarkable. No pancreatic ductal dilatation or
surrounding inflammatory changes.

Spleen: Measures 8.6 x 5.2 by 11.2 cm (volume = 260 cm^3)

Adrenals/Urinary Tract: Normal adrenal glands. No kidney mass or
hydronephrosis identified. Urinary bladder is unremarkable.

Stomach/Bowel: Stomach is nondistended. No bowel wall thickening,
inflammation or distension.

Vascular/Lymphatic: Normal appearance of the abdominal aorta. No
aneurysm. Portal vein appears increased in caliber measuring 1.6 cm.
Upper abdominal varices including recanalization of the umbilical
vein. No abdominopelvic adenopathy.

Reproductive: Uterus and bilateral adnexa are unremarkable.

Other: Large volume of ascites is identified within the abdomen and
pelvis. This is new when compared with 01/27/2018.

Musculoskeletal: No acute or significant osseous findings.
IMPRESSION: 1. Morphologic features of the liver compatible with cirrhosis.
Stigmata of portal venous hypertension is noted including large
volume of ascites and upper abdominal varices. The spleen has a
normal size.
2. There is a suspicion of a focal low-attenuation structure within
the inferior right hepatic lobe measuring 3.1 cm. When the patient
is clinically stable and able to follow directions and hold their
breath (preferably as an outpatient) further evaluation with
dedicated abdominal MRI should be considered.
3. Trace bilateral pleural effusions.
